# Patient Record
Sex: Male | Born: 1970 | Race: White | Hispanic: No | Marital: Single | Smoking: Former smoker
Health system: Southern US, Community
[De-identification: ages and names within clinical notes are randomized; demographics above are authoritative.]

## PROBLEM LIST (undated history)

## (undated) DIAGNOSIS — E785 Hyperlipidemia, unspecified: Secondary | ICD-10-CM

## (undated) DIAGNOSIS — I10 Essential (primary) hypertension: Secondary | ICD-10-CM

## (undated) HISTORY — PX: NO PAST SURGERIES: SHX2092

## (undated) HISTORY — DX: Hyperlipidemia, unspecified: E78.5

---

## 2019-02-08 ENCOUNTER — Encounter: Payer: Self-pay | Admitting: Emergency Medicine

## 2019-02-08 ENCOUNTER — Ambulatory Visit
Admission: EM | Admit: 2019-02-08 | Discharge: 2019-02-08 | Disposition: A | Discharge status: Home / Self Care | Payer: Self-pay | Attending: Emergency Medicine | Admitting: Emergency Medicine

## 2019-02-08 ENCOUNTER — Other Ambulatory Visit: Payer: Self-pay

## 2019-02-08 DIAGNOSIS — Z760 Encounter for issue of repeat prescription: Secondary | ICD-10-CM

## 2019-02-08 DIAGNOSIS — I10 Essential (primary) hypertension: Secondary | ICD-10-CM

## 2019-02-08 HISTORY — DX: Essential (primary) hypertension: I10

## 2019-02-08 MED ORDER — LISINOPRIL-HYDROCHLOROTHIAZIDE 20-25 MG PO TABS: 1.0000 | ORAL_TABLET | Freq: Every day | ORAL | 1 refills | Status: DC

## 2019-02-08 NOTE — Discharge Instructions (Addendum)
Continue monitoring your blood pressure.  Continue the niacin and fish oil.  Follow-up with a primary care physician of your choice, see list below.  You may be able to seen earlier in November.  I have given you a month's worth of medication with 1 refill.   Return immediately to the ER if you start having chest pain, headache, problems seeing, problems talking, problems walking, if you feel like you're about to pass out, if you do pass out, if you have a seizure, or for any other concerns.  Here is a list of primary care providers who are taking new patients:  Dr. Otilio Miu, Dr. Adline Potter [address] [place] [address] [place] [address]  [phone]  [facility] [address] [place], [place] [address] [phone]  [facility]  [phone] [place], [place] [address]  Here are clinics/ other resources who will see you if you do not have insurance. Some have certain criteria that you must meet. Call them and find out what they are:  [facility]: [address], [place], [place] [address] Phone: [phone] Hours: First and Third Saturdays of each Month, 9 a.m. - 1 p.m.  [facility]: [address], [place], [place], [place] [address] Phone: [phone] Hours: Tuesday, 4 p.m. - 8 p.m. Thursday, 1 p.m. - 8 p.m. Wednesday, 9 a.m. - [facility] [address] [place], [place] [address] Phone: [phone] Pharmacy Phone Number: [phone] Dental Phone Number: [phone] [place] [facility]: [phone]  Dental Hours: Monday - Thursday, 8 a.m. - 6 p.m.  [facility] [address], [place], [place] [address] Phone: [phone] Pharmacy Phone Number: [phone] [facility] Insurance Help: [phone]  [facility] [address] [place], [place] [address] Phone:  [phone] Pharmacy Phone Number: [phone] [facility] Insurance Help: [phone]  [facility] [address] [place], [place] [address] Phone: [phone] [facility] Insurance Help: [phone]   [address] [place], [place] [address] Phone: [phone]  Go to www.goodrx.com to look up your medications. This will give you a list of where you can find your prescriptions at the most affordable prices. Or ask the pharmacist what the cash price is, or if they have any other discount programs available to help make your medication more affordable. This can be less expensive than what you would pay with insurance.

## 2019-02-08 NOTE — ED Provider Notes (Signed)
HPI  SUBJECTIVE:  Joshua Shaw is a 48 y.o. male who presents for medication refill.  States that he ran out of his lisinopril/hydrochlorothiazide 20/25 2 days ago.  States that his PMD recently passed away, and he is unable to see anyone until November.  States that he has been on this dose for years, and it controls his blood pressure well.  He measures his blood pressure at home, states that has been running a little higher since running out, but has had no symptoms.  Denies headaches, dizziness, chest pain, shortness of breath, abdominal pain, hematuria, anuria, leg swelling.  He does not take any other prescription medications.  He has a past medical history of hypertension.  PMD: None.   Past Medical History:  Diagnosis Date  . Hypertension     Past Surgical History:  Procedure Laterality Date  . NO PAST SURGERIES      Family History  Problem Relation Age of Onset  . Cancer Mother        melonma  . Cancer Father        liver    Social History   Tobacco Use  . Smoking status: Former Research scientist (life sciences)  . Smokeless tobacco: Never Used  Substance Use Topics  . Alcohol use: Not Currently  . Drug use: Not Currently    No current facility-administered medications for this encounter.   Current Outpatient Medications:  .  niacin 500 MG tablet, Take 500 mg by mouth at bedtime., Disp: , Rfl:  .  Omega-3 Fatty Acids (FISH OIL) 1000 MG CPDR, Take 2,000 mg by mouth 2 (two) times daily., Disp: , Rfl:  .  lisinopril-hydrochlorothiazide (ZESTORETIC) 20-25 MG tablet, Take 1 tablet by mouth daily., Disp: 30 tablet, Rfl: 1  No Known Allergies   ROS  As noted in HPI.   Physical Exam  BP (!) 144/81 (BP Location: Right Arm)   Pulse 75   Temp 98.4 F (36.9 C) (Oral)   Resp 18   Ht 5\' 10"  (1.778 m)   Wt (!) 147.4 kg   SpO2 98%   BMI 46.63 kg/m   Constitutional: Well developed, well nourished, no acute distress Eyes:  EOMI, conjunctiva normal bilaterally HENT: Normocephalic,  atraumatic,mucus membranes moist Respiratory: Normal inspiratory effort Cardiovascular: Normal rate GI: nondistended skin: No rash, skin intact Musculoskeletal: no deformities Neurologic: Alert & oriented x 3, no focal neuro deficits Psychiatric: Speech and behavior appropriate   ED Course   Medications - No data to display  No orders of the defined types were placed in this encounter.   No results found for this or any previous visit (from the past 24 hour(s)). No results found.  ED Clinical Impression  1. Essential hypertension   2. Medication refill      ED Assessment/Plan  will refill lisinopril/hydrochlorothiazide 20/25, 1 tab daily #30 with 1 refill.  Will provide primary care list so that he can be seen sooner.  Meds ordered this encounter  Medications  . lisinopril-hydrochlorothiazide (ZESTORETIC) 20-25 MG tablet    Sig: Take 1 tablet by mouth daily.    Dispense:  30 tablet    Refill:  1    *This clinic note was created using L[employer]. Therefore, there may be occasional mistakes despite careful proofreading.   ?    Melynda Ripple, MD 02/08/19 [phone]

## 2019-02-08 NOTE — ED Triage Notes (Signed)
Pt presents to [facility] for a refill on his blood pressure medication. He states his former PCP passed away and he can not get in with another PCP until Nov.  He takes lisinopril/HCTZ 20/25mg .

## 2019-03-18 ENCOUNTER — Encounter: Payer: Self-pay | Admitting: Family Medicine

## 2019-03-18 ENCOUNTER — Ambulatory Visit (INDEPENDENT_AMBULATORY_CARE_PROVIDER_SITE_OTHER): Payer: Self-pay | Admitting: Family Medicine

## 2019-03-18 ENCOUNTER — Other Ambulatory Visit: Payer: Self-pay

## 2019-03-18 VITALS — BP 120/70 | HR 64 | Ht 70.0 in | Wt 335.0 lb

## 2019-03-18 DIAGNOSIS — R160 Hepatomegaly, not elsewhere classified: Secondary | ICD-10-CM

## 2019-03-18 DIAGNOSIS — I10 Essential (primary) hypertension: Secondary | ICD-10-CM

## 2019-03-18 DIAGNOSIS — Z7689 Persons encountering health services in other specified circumstances: Secondary | ICD-10-CM

## 2019-03-18 DIAGNOSIS — E785 Hyperlipidemia, unspecified: Secondary | ICD-10-CM

## 2019-03-18 MED ORDER — LISINOPRIL-HYDROCHLOROTHIAZIDE 20-25 MG PO TABS: 1.0000 | ORAL_TABLET | Freq: Every day | ORAL | 1 refills | Status: DC

## 2019-03-18 NOTE — Progress Notes (Signed)
Date:  03/18/2019   Name:  Joshua Shaw   DOB:  11-04-1970   MRN:  [id number]   Chief Complaint: Establish Care and Hypertension  Hypertension This is a new problem. The current episode started more than 1 year ago. The problem is unchanged. The problem is controlled. Pertinent negatives include no anxiety, blurred vision, chest pain, headaches, malaise/fatigue, neck pain, orthopnea, palpitations, peripheral edema, PND, shortness of breath or sweats. There are no associated agents to hypertension. Risk factors for coronary artery disease include dyslipidemia and obesity. Past treatments include diuretics and ACE inhibitors. The current treatment provides moderate improvement. Compliance problems include diet.  There is no history of angina, kidney disease, CAD/MI, CVA, heart failure, left ventricular hypertrophy, PVD or retinopathy. There is no history of chronic renal disease, a hypertension causing med or renovascular disease.    No results found for: CREATININE, BUN, NA, K, CL, CO2 No results found for: CHOL, HDL, LDLCALC, LDLDIRECT, TRIG, CHOLHDL No results found for: TSH No results found for: HGBA1C   Review of Systems  Constitutional: Negative for chills, fever and malaise/fatigue.  HENT: Negative for drooling, ear discharge, ear pain, rhinorrhea, sinus pain and sore throat.   Eyes: Negative for blurred vision.  Respiratory: Negative for cough, shortness of breath and wheezing.   Cardiovascular: Negative for chest pain, palpitations, orthopnea, leg swelling and PND.  Gastrointestinal: Negative for abdominal pain, blood in stool, constipation, diarrhea and nausea.  Endocrine: Negative for polydipsia.  Genitourinary: Negative for dysuria, frequency, hematuria and urgency.  Musculoskeletal: Negative for back pain, myalgias and neck pain.  Skin: Negative for rash.  Allergic/Immunologic: Negative for environmental allergies.  Neurological: Negative for dizziness and  headaches.  Hematological: Does not bruise/bleed easily.  Psychiatric/Behavioral: Negative for suicidal ideas. The patient is not nervous/anxious.     There are no active problems to display for this patient.   No Known Allergies  Past Surgical History:  Procedure Laterality Date  . NO PAST SURGERIES      Social History   Tobacco Use  . Smoking status: Former Smoker    Years: 8.00    Types: Cigarettes    Quit date: 1998    Years since quitting: 22.9  . Smokeless tobacco: Never Used  Substance Use Topics  . Alcohol use: Not Currently  . Drug use: Not Currently     Medication list has been reviewed and updated.  Current Meds  Medication Sig  . aspirin EC 81 MG tablet Take 81 mg by mouth daily.  Marland Kitchen lisinopril-hydrochlorothiazide (ZESTORETIC) 20-25 MG tablet Take 1 tablet by mouth daily.  . Multiple Vitamin (ONE-A-DAY MENS PO) Take 1 capsule by mouth daily.  . niacin 500 MG tablet Take 500 mg by mouth at bedtime. otc  . Omega-3 Fatty Acids (FISH OIL) 1000 MG CPDR Take 1,200 mg by mouth 3 (three) times daily.     PHQ 2/9 Scores 03/18/2019  PHQ - 2 Score 0  PHQ- 9 Score 0    BP Readings from Last 3 Encounters:  03/18/19 120/70  02/08/19 (!) 144/81    Physical Exam Vitals signs and nursing note reviewed.  HENT:     Head: Normocephalic.     Right Ear: Tympanic membrane and external ear normal.     Left Ear: Tympanic membrane and external ear normal.     Nose: Nose normal. No congestion or rhinorrhea.  Eyes:     General: No scleral icterus.       Right eye: No  discharge.        Left eye: No discharge.     Conjunctiva/sclera: Conjunctivae normal.     Pupils: Pupils are equal, round, and reactive to light.  Neck:     Musculoskeletal: Normal range of motion and neck supple.     Thyroid: No thyromegaly.     Vascular: No JVD.     Trachea: No tracheal deviation.  Cardiovascular:     Rate and Rhythm: Normal rate and regular rhythm.     Heart sounds: Normal heart  sounds. No murmur. No friction rub. No gallop.   Pulmonary:     Effort: No respiratory distress.     Breath sounds: Normal breath sounds. No wheezing, rhonchi or rales.  Abdominal:     General: Bowel sounds are normal.     Palpations: Abdomen is soft. There is hepatomegaly. There is no splenomegaly or mass.     Tenderness: There is no abdominal tenderness. There is no guarding or rebound.  Musculoskeletal: Normal range of motion.        General: No tenderness. Injury:   Lymphadenopathy:     Cervical: No cervical adenopathy.  Skin:    General: Skin is warm.     Findings: No rash.  Neurological:     Mental Status: He is alert and oriented to person, place, and time.     Cranial Nerves: No cranial nerve deficit.     Deep Tendon Reflexes: Reflexes are normal and symmetric.     Wt Readings from Last 3 Encounters:  03/18/19 (!) 335 lb (152 kg)  02/08/19 (!) 325 lb (147.4 kg)    BP 120/70   Pulse 64   Ht 5\' 10"  (1.778 m)   Wt (!) 335 lb (152 kg)   BMI 48.07 kg/m   Assessment and Plan:  1. Establishing care with new doctor, encounter for Patient establishes care with new physician.  Patient's previous exams were evaluated but only at the urgent care was available from downstairs.  Patient's records from [facility] urgent care is not available because it is not on epic and they have since closed.  2. Essential hypertension Chronic.  Controlled.  Stable.  Will continue lisinopril hydrochlorothiazide 20-25 mg once a day.  Will check renal function panel. - Renal Function Panel - lisinopril-hydrochlorothiazide (ZESTORETIC) 20-25 MG tablet; Take 1 tablet by mouth daily.  Dispense: 90 tablet; Refill: 1  3. Hyperlipidemia, unspecified hyperlipidemia type Patient has had a history of elevated cholesterol but is only on niacin and omega-3.  We will check a lipid panel because I have concerns because of the fourth evaluation. - Lipid Panel With LDL/HDL Ratio  4. Hepatomegaly On exam  patient was noted to have a palpable liver edge that was smooth.  This is compatible with an hepatomegaly circumstance.  We will check and confirm this with ultrasound of the liver and obtain a hepatic function panel.  I am concerned about steatosis because of the patient's obesity and probable diet indiscretion.  Will recheck in 6 weeks. - Hepatic Function Panel (6) - [place] Abdomen Limited RUQ; Future

## 2019-03-19 LAB — RENAL FUNCTION PANEL
Albumin: 4.4 g/dL (ref 4.0–5.0)
BUN/Creatinine Ratio: 13 (ref 9–20)
BUN: 9 mg/dL (ref 6–24)
CO2: 24 mmol/L (ref 20–29)
Calcium: 9.7 mg/dL (ref 8.7–10.2)
Chloride: 100 mmol/L (ref 96–106)
Creatinine, Ser: 0.72 mg/dL — ABNORMAL LOW (ref 0.76–1.27)
GFR calc Af Amer: 127 mL/min/{1.73_m2} (ref >59)
GFR calc non Af Amer: 110 mL/min/{1.73_m2} (ref >59)
Glucose: 110 mg/dL — ABNORMAL HIGH (ref 65–99)
Phosphorus: 3.1 mg/dL (ref 2.8–4.1)
Potassium: 3.9 mmol/L (ref 3.5–5.2)
Sodium: 139 mmol/L (ref 134–144)

## 2019-03-19 LAB — LIPID PANEL WITH LDL/HDL RATIO
Cholesterol, Total: 223 mg/dL — ABNORMAL HIGH (ref 100–199)
HDL: 28 mg/dL — ABNORMAL LOW (ref >39)
LDL Chol Calc (NIH): 151 mg/dL — ABNORMAL HIGH (ref 0–99)
LDL/HDL Ratio: 5.4 ratio — ABNORMAL HIGH (ref 0.0–3.6)
Triglycerides: 236 mg/dL — ABNORMAL HIGH (ref 0–149)
VLDL Cholesterol Cal: 44 mg/dL — ABNORMAL HIGH (ref 5–40)

## 2019-03-19 LAB — HEPATIC FUNCTION PANEL (6)
ALT: 70 IU/L — ABNORMAL HIGH (ref 0–44)
AST: 65 IU/L — ABNORMAL HIGH (ref 0–40)
Alkaline Phosphatase: 113 IU/L (ref 39–117)
Bilirubin Total: 0.8 mg/dL (ref 0.0–1.2)
Bilirubin, Direct: 0.19 mg/dL (ref 0.00–0.40)

## 2019-03-29 ENCOUNTER — Ambulatory Visit: Payer: Self-pay

## 2019-04-01 ENCOUNTER — Other Ambulatory Visit: Payer: Self-pay

## 2019-04-01 ENCOUNTER — Ambulatory Visit
Admission: RE | Admit: 2019-04-01 | Discharge: 2019-04-01 | Disposition: A | Discharge status: Home / Self Care | Payer: Self-pay | Source: Ambulatory Visit | Attending: Family Medicine | Admitting: Family Medicine

## 2019-04-01 DIAGNOSIS — R160 Hepatomegaly, not elsewhere classified: Secondary | ICD-10-CM | POA: Insufficient documentation

## 2019-04-02 ENCOUNTER — Other Ambulatory Visit: Payer: Self-pay

## 2019-04-02 DIAGNOSIS — K7460 Unspecified cirrhosis of liver: Secondary | ICD-10-CM

## 2019-04-02 NOTE — Progress Notes (Unsigned)
Put in Monday GI appt

## 2019-04-08 ENCOUNTER — Encounter: Payer: Self-pay | Admitting: Family Medicine

## 2019-04-08 ENCOUNTER — Other Ambulatory Visit: Payer: Self-pay

## 2019-04-08 ENCOUNTER — Ambulatory Visit (INDEPENDENT_AMBULATORY_CARE_PROVIDER_SITE_OTHER): Payer: Self-pay | Admitting: Family Medicine

## 2019-04-08 VITALS — BP 130/80 | HR 80 | Ht 70.0 in | Wt 336.0 lb

## 2019-04-08 DIAGNOSIS — K7460 Unspecified cirrhosis of liver: Secondary | ICD-10-CM

## 2019-04-08 NOTE — Progress Notes (Signed)
Date:  04/08/2019   Name:  Joshua Shaw   DOB:  13-Mar-1971   MRN:  [id number]   Chief Complaint: consult [place]  Patient is a 48 year old male who presents for a hepatic exam. The patient reports the following problems: cirrhosis. Health maintenance has been reviewed up to date.   Lab Results  Component Value Date   CREATININE 0.72 (L) 03/18/2019   BUN 9 03/18/2019   NA 139 03/18/2019   K 3.9 03/18/2019   CL 100 03/18/2019   CO2 24 03/18/2019   Lab Results  Component Value Date   CHOL 223 (H) 03/18/2019   HDL 28 (L) 03/18/2019   LDLCALC 151 (H) 03/18/2019   TRIG 236 (H) 03/18/2019   No results found for: TSH No results found for: HGBA1C   Review of Systems  Constitutional: Negative for chills and fever.  HENT: Negative for drooling, ear discharge, ear pain, postnasal drip and sore throat.   Respiratory: Negative for cough, shortness of breath and wheezing.   Cardiovascular: Negative for chest pain, palpitations and leg swelling.  Gastrointestinal: Negative for abdominal pain, blood in stool, constipation, diarrhea and nausea.  Endocrine: Negative for polydipsia.  Genitourinary: Negative for dysuria, frequency, hematuria and urgency.  Musculoskeletal: Negative for back pain, myalgias and neck pain.  Skin: Negative for rash.  Allergic/Immunologic: Negative for environmental allergies.  Neurological: Negative for dizziness and headaches.  Hematological: Does not bruise/bleed easily.  Psychiatric/Behavioral: Negative for suicidal ideas. The patient is not nervous/anxious.     There are no problems to display for this patient.   No Known Allergies  Past Surgical History:  Procedure Laterality Date  . NO PAST SURGERIES      Social History   Tobacco Use  . Smoking status: Former Smoker    Years: 8.00    Types: Cigarettes    Quit date: 1998    Years since quitting: 22.9  . Smokeless tobacco: Never Used  Substance Use Topics  . Alcohol use: Not  Currently  . Drug use: Not Currently     Medication list has been reviewed and updated.  Current Meds  Medication Sig  . aspirin EC 81 MG tablet Take 81 mg by mouth daily.  Marland Kitchen lisinopril-hydrochlorothiazide (ZESTORETIC) 20-25 MG tablet Take 1 tablet by mouth daily.  . Multiple Vitamin (ONE-A-DAY MENS PO) Take 1 capsule by mouth daily.  . niacin 500 MG tablet Take 500 mg by mouth at bedtime. otc  . Omega-3 Fatty Acids (FISH OIL) 1000 MG CPDR Take 1,200 mg by mouth 3 (three) times daily.     PHQ 2/9 Scores 03/18/2019  PHQ - 2 Score 0  PHQ- 9 Score 0    BP Readings from Last 3 Encounters:  04/08/19 130/80  03/18/19 120/70  02/08/19 (!) 144/81    Physical Exam Vitals and nursing note reviewed.  HENT:     Head: Normocephalic.     Right Ear: Tympanic membrane and external ear normal.     Left Ear: Tympanic membrane and external ear normal.     Nose: Nose normal. No congestion or rhinorrhea.  Eyes:     General: No scleral icterus.       Right eye: No discharge.        Left eye: No discharge.     Conjunctiva/sclera: Conjunctivae normal.     Pupils: Pupils are equal, round, and reactive to light.  Neck:     Thyroid: No thyromegaly.     Vascular: No  JVD.     Trachea: No tracheal deviation.  Cardiovascular:     Rate and Rhythm: Normal rate and regular rhythm.     Heart sounds: Normal heart sounds. No murmur. No friction rub. No gallop.   Pulmonary:     Effort: No respiratory distress.     Breath sounds: Normal breath sounds. No wheezing, rhonchi or rales.  Abdominal:     General: Bowel sounds are normal.     Palpations: Abdomen is soft. There is hepatomegaly. There is no splenomegaly or mass.     Tenderness: There is no abdominal tenderness. There is no guarding or rebound.     Comments: Firm palpable liver edge.Nontender.  Musculoskeletal:        General: No tenderness. Normal range of motion.     Cervical back: Normal range of motion and neck supple.  Lymphadenopathy:       Cervical: No cervical adenopathy.  Skin:    General: Skin is warm.     Findings: No rash.  Neurological:     Mental Status: He is alert and oriented to person, place, and time.     Cranial Nerves: No cranial nerve deficit.     Deep Tendon Reflexes: Reflexes are normal and symmetric.     Wt Readings from Last 3 Encounters:  04/08/19 (!) 336 lb (152.4 kg)  03/18/19 (!) 335 lb (152 kg)  02/08/19 (!) 325 lb (147.4 kg)    BP 130/80   Pulse 80   Ht 5\' 10"  (1.778 m)   Wt (!) 336 lb (152.4 kg)   BMI 48.21 kg/m   Assessment and Plan: 1. Hepatic cirrhosis, unspecified hepatic cirrhosis type, unspecified whether ascites present [facility]) Patient returns for determination of direction of evaluation of a palpable liver edge with mild transaminase elevation and ultrasound that is suggesting of cirrhosis.  On review of patient's family history patient does note that his father died of what sounds like hepatic failure after presenting with ascites over a short period of time.  Patient is concerned whether or not that this may be of a similar nature in the future.  I have assured patient that we will process going and evaluating further with further lab work, possible further imaging, possible liver biopsy.  I spoke with [facility] for [facility] and they are in the process of getting him into gastroenterology clinic with [facility] clinic and [facility]. - Ambulatory referral to Gastroenterology

## 2019-04-08 NOTE — Patient Instructions (Signed)
Alpha-1 Antitrypsin Deficiency, Adult  Alpha-1 antitrypsin (AAT) is a protein that helps the lungs and other organs work properly. It is made by the liver. AAT deficiency is a genetic condition that happens when the liver produces too little AAT, no AAT, or AAT that does not work properly. AAT deficiency can cause liver disease, lung disease, and a skin condition called panniculitis (rare). Being around things like smoke and dust can make these problems worse. What are the causes? This condition is caused by a genetic defect that is passed from parent to child (inherited). The condition typically develops only if a person inherits the defective gene from both parents. What increases the risk? You are more likely to develop this condition if:  You have a family history of the condition.  You are Caucasian and have European ancestry. What are the signs or symptoms? Symptoms of this condition include:  Shortness of breath.  Asthma that is difficult to control.  Having noisy breathing (wheeze).  Coughing.  Recurring infections in the breathing (respiratory) system.  Unexplained weight loss.  Rapid heartbeat.  Fatigue.  Abdominal pain.  A yellowish color of the skin or the white parts of the eyes (jaundice).  Swelling of the ankles or abdomen.  Hardened skin with painful lumps(panniculitis). How is this diagnosed? This condition is diagnosed with a blood test or with a DNA sample taken from your mouth. You may also have other tests, including:  Imaging studies of your chest, such as an X-ray or CT scan.  Lung function tests to see how well your lungs are working.  Liver function tests to see how well your liver is working.  A liver biopsy to check for damage to your liver. How is this treated? There is no cure for AAT deficiency. However, treatments can relieve symptoms and improve quality of life. Treatment options include:  Inhaled steroids or bronchodilators. These  medicines can help with breathing problems.  Pulmonary rehabilitation. This is a program that helps to improve lung function and teaches you to breathe more efficiently.  AAT augmentation therapy. This involves weekly injections to increase your level of AAT.  Prescription vitamins, including vitamins D, E, and K. Vitamins can help to maintain normal liver function.  Medicine to relieve symptoms that are related to liver problems, including jaundice, fluid retention, and severe itching.  A lung transplant. This is rarely needed. It may help someone who has a severe AAT deficiency. Follow these instructions at home: Lifestyle   Do not use any products that contain nicotine or tobacco, such as cigarettes and e-cigarettes. If you need help quitting, ask your health care provider.  Avoid secondhand smoke.  Do not drink alcohol.  Exercise regularly. Reducing breathing problems  Stay indoors when air quality is poor.  Wear a mask to keep your airways free of dust.  Talk to your health care provider about getting flu (influenza) and pneumonia shots to prevent respiratory infections.  Wash your hands often to prevent infections.  Avoid activities such as mowing the lawn or vacuuming when possible. General instructions  Take over-the-counter and prescription medicines only as told by your health care provider.  Keep all follow-up visits as told by your health care provider. This is important. Contact a health care provider if you:  Often get respiratory infections, such as bronchitis or pneumonia.  Wheeze, cough, or have other breathing problems that are not helped with medicine.  Experience new liver-related problems, such as jaundice. Get help right away if you:  Notice that your respiratory infection does not go away completely, or it returns after treatment.  Become dizzy or weak or you pass out because your breathing problems are so bad. Summary  Alpha-1 antitrypsin is  a protein that is made in the liver and helps the lungs and other organs work properly.  A deficiency of this protein can occur if a person inherits the defective gene from both parents. It is diagnosed with a blood or DNA test.  Symptoms of the deficiency include coughing, shortness of breath, and fatigue.  The deficiency is treated with supportive measures that will help your breathing and sometimes with injections of the protein.  Call your health care provider if you have breathing problems that will not go away. This information is not intended to replace advice given to you by your health care provider. Make sure you discuss any questions you have with your health care provider. Document Released: 03/12/2004 Document Revised: 08/18/2017 Document Reviewed: 02/06/2017 Elsevier Patient Education  2020 [employer].

## 2019-04-17 ENCOUNTER — Other Ambulatory Visit: Payer: Self-pay | Admitting: Gastroenterology

## 2019-04-17 DIAGNOSIS — R748 Abnormal levels of other serum enzymes: Secondary | ICD-10-CM

## 2019-04-24 ENCOUNTER — Ambulatory Visit: Payer: Self-pay

## 2019-04-29 ENCOUNTER — Ambulatory Visit
Admission: RE | Admit: 2019-04-29 | Discharge: 2019-04-29 | Disposition: A | Discharge status: Home / Self Care | Payer: Self-pay | Source: Ambulatory Visit | Attending: Gastroenterology | Admitting: Gastroenterology

## 2019-04-29 ENCOUNTER — Other Ambulatory Visit: Payer: Self-pay

## 2019-04-29 ENCOUNTER — Other Ambulatory Visit
Admission: RE | Admit: 2019-04-29 | Discharge: 2019-04-29 | Disposition: A | Discharge status: Home / Self Care | Payer: Self-pay | Source: Ambulatory Visit | Attending: Gastroenterology | Admitting: Gastroenterology

## 2019-04-29 DIAGNOSIS — R748 Abnormal levels of other serum enzymes: Secondary | ICD-10-CM | POA: Insufficient documentation

## 2019-05-02 LAB — MISC LABCORP TEST (SEND OUT): Labcorp test code: 511345

## 2019-06-18 ENCOUNTER — Ambulatory Visit: Payer: Self-pay | Admitting: Family Medicine

## 2019-06-24 ENCOUNTER — Ambulatory Visit: Payer: Self-pay | Admitting: Family Medicine

## 2019-07-22 ENCOUNTER — Ambulatory Visit: Payer: Self-pay | Admitting: Family Medicine

## 2019-08-12 ENCOUNTER — Encounter: Payer: Self-pay | Admitting: Family Medicine

## 2019-08-12 ENCOUNTER — Ambulatory Visit (INDEPENDENT_AMBULATORY_CARE_PROVIDER_SITE_OTHER): Payer: Self-pay | Admitting: Family Medicine

## 2019-08-12 ENCOUNTER — Other Ambulatory Visit: Payer: Self-pay

## 2019-08-12 VITALS — BP 124/70 | HR 64 | Ht 70.0 in | Wt 319.0 lb

## 2019-08-12 DIAGNOSIS — K7460 Unspecified cirrhosis of liver: Secondary | ICD-10-CM | POA: Insufficient documentation

## 2019-08-12 DIAGNOSIS — E782 Mixed hyperlipidemia: Secondary | ICD-10-CM

## 2019-08-12 DIAGNOSIS — I10 Essential (primary) hypertension: Secondary | ICD-10-CM

## 2019-08-12 DIAGNOSIS — R739 Hyperglycemia, unspecified: Secondary | ICD-10-CM

## 2019-08-12 MED ORDER — LISINOPRIL-HYDROCHLOROTHIAZIDE 20-25 MG PO TABS: 1.0000 | ORAL_TABLET | Freq: Every day | ORAL | 1 refills | Status: DC

## 2019-08-12 NOTE — Patient Instructions (Signed)

## 2019-08-12 NOTE — Progress Notes (Signed)
Date:  08/12/2019   Name:  Joshua Shaw   DOB:  [birth year]   MRN:  [id number]   Chief Complaint: Hypertension and Hyperlipidemia (needs labs)  Hypertension This is a chronic problem. The current episode started more than 1 year ago. The problem has been waxing and waning since onset. The problem is controlled. Pertinent negatives include no anxiety, blurred vision, chest pain, headaches, malaise/fatigue, neck pain, orthopnea, palpitations, peripheral edema, PND, shortness of breath or sweats. There are no associated agents to hypertension. Risk factors for coronary artery disease include dyslipidemia and male gender. Past treatments include ACE inhibitors and diuretics. The current treatment provides moderate improvement. There are no compliance problems.  There is no history of angina, kidney disease, CAD/MI, CVA, heart failure, left ventricular hypertrophy, PVD or retinopathy. There is no history of chronic renal disease, a hypertension causing med or renovascular disease.  Hyperlipidemia This is a chronic problem. The current episode started more than 1 year ago. The problem is controlled. Recent lipid tests were reviewed and are normal. He has no history of chronic renal disease, diabetes, hypothyroidism, liver disease, obesity or nephrotic syndrome. Pertinent negatives include no chest pain, focal sensory loss, focal weakness, leg pain, myalgias or shortness of breath. The current treatment provides moderate improvement of lipids. There are no compliance problems.     Lab Results  Component Value Date   CREATININE 0.72 (L) 03/18/2019   BUN 9 03/18/2019   NA 139 03/18/2019   K 3.9 03/18/2019   CL 100 03/18/2019   CO2 24 03/18/2019   Lab Results  Component Value Date   CHOL 223 (H) 03/18/2019   HDL 28 (L) 03/18/2019   LDLCALC 151 (H) 03/18/2019   TRIG 236 (H) 03/18/2019   No results found for: TSH No results found for: HGBA1C No results found for: WBC, HGB, HCT, MCV,  PLT Lab Results  Component Value Date   ALT 70 (H) 03/18/2019   AST 65 (H) 03/18/2019   ALKPHOS 113 03/18/2019   BILITOT 0.8 03/18/2019     Review of Systems  Constitutional: Negative for chills, fever and malaise/fatigue.  HENT: Negative for drooling, ear discharge, ear pain and sore throat.   Eyes: Negative for blurred vision.  Respiratory: Negative for cough, shortness of breath and wheezing.   Cardiovascular: Negative for chest pain, palpitations, orthopnea, leg swelling and PND.  Gastrointestinal: Negative for abdominal pain, blood in stool, constipation, diarrhea and nausea.  Endocrine: Negative for polydipsia.  Genitourinary: Negative for dysuria, frequency, hematuria and urgency.  Musculoskeletal: Negative for back pain, myalgias and neck pain.  Skin: Negative for rash.  Allergic/Immunologic: Negative for environmental allergies.  Neurological: Negative for dizziness, focal weakness and headaches.  Hematological: Does not bruise/bleed easily.  Psychiatric/Behavioral: Negative for suicidal ideas. The patient is not nervous/anxious.     There are no problems to display for this patient.   No Known Allergies  Past Surgical History:  Procedure Laterality Date  . NO PAST SURGERIES      Social History   Tobacco Use  . Smoking status: Former Smoker    Years: 8.00    Types: Cigarettes    Quit date: 1998    Years since quitting: 23.3  . Smokeless tobacco: Never Used  Substance Use Topics  . Alcohol use: Not Currently  . Drug use: Not Currently     Medication list has been reviewed and updated.  Current Meds  Medication Sig  . aspirin EC 81 MG tablet Take  81 mg by mouth daily.  Marland Kitchen lisinopril-hydrochlorothiazide (ZESTORETIC) 20-25 MG tablet Take 1 tablet by mouth daily.  . Multiple Vitamin (ONE-A-DAY MENS PO) Take 1 capsule by mouth daily.  . niacin 500 MG tablet Take 500 mg by mouth at bedtime. otc  . Omega-3 Fatty Acids (FISH OIL) 1000 MG CPDR Take 1,200 mg  by mouth in the morning and at bedtime.     PHQ 2/9 Scores 08/12/2019 03/18/2019  PHQ - 2 Score 1 0  PHQ- 9 Score 1 0    BP Readings from Last 3 Encounters:  08/12/19 124/70  04/08/19 130/80  03/18/19 120/70    Physical Exam Vitals and nursing note reviewed.  HENT:     Head: Normocephalic.     Right Ear: Tympanic membrane, ear canal and external ear normal.     Left Ear: Tympanic membrane, ear canal and external ear normal.     Nose: Nose normal. No congestion or rhinorrhea.     Mouth/Throat:     Mouth: Mucous membranes are moist.  Eyes:     General: No scleral icterus.       Right eye: No discharge.        Left eye: No discharge.     Conjunctiva/sclera: Conjunctivae normal.     Pupils: Pupils are equal, round, and reactive to light.  Neck:     Thyroid: No thyromegaly.     Vascular: No JVD.     Trachea: No tracheal deviation.  Cardiovascular:     Rate and Rhythm: Normal rate and regular rhythm.     Heart sounds: Normal heart sounds. No murmur. No friction rub. No gallop.   Pulmonary:     Effort: No respiratory distress.     Breath sounds: Normal breath sounds. No wheezing, rhonchi or rales.  Abdominal:     General: Bowel sounds are normal.     Palpations: Abdomen is soft. There is no mass.     Tenderness: There is no abdominal tenderness. There is no guarding or rebound.  Musculoskeletal:        General: No tenderness. Normal range of motion.     Cervical back: Normal range of motion and neck supple.  Lymphadenopathy:     Cervical: No cervical adenopathy.  Skin:    General: Skin is warm.     Findings: No rash.  Neurological:     Mental Status: He is alert and oriented to person, place, and time.     Cranial Nerves: No cranial nerve deficit.     Deep Tendon Reflexes: Reflexes are normal and symmetric.     Wt Readings from Last 3 Encounters:  08/12/19 (!) 319 lb (144.7 kg)  04/08/19 (!) 336 lb (152.4 kg)  03/18/19 (!) 335 lb (152 kg)    BP 124/70   Pulse  64   Ht 5\' 10"  (1.778 m)   Wt (!) 319 lb (144.7 kg)   BMI 45.77 kg/m   Assessment and Plan: 1. Essential hypertension Chronic.  Controlled.  Stable.  Continue lisinopril hydrochlorothiazide 20-25 mg once a day.  Will check renal function panel. - Renal Function Panel - lisinopril-hydrochlorothiazide (ZESTORETIC) 20-25 MG tablet; Take 1 tablet by mouth daily.  Dispense: 90 tablet; Refill: 1  2. Hepatic cirrhosis, unspecified hepatic cirrhosis type, unspecified whether ascites present (HCC) Chronic.  Controlled.  Stable.  Patient with history of fatty liver with some cirrhosis being followed by GI/Christy [facility].  Will check lipid panel to see what the extent of hyperlipidemia patient has  been on lovastatin in the past and has tolerated will consider either atorvastatin or Zetia if indicated. - Lipid panel  3. Hyperglycemia Patient has a history of mild hyperglycemia but he is somewhat overweight and is at risk for prediabetes and we will check hemoglobin A1c. - Hemoglobin A1c  4. Mixed hyperlipidemia On evaluation patient has elevations of triglycerides total cholesterol as well as LDL patient also has a decrease in HDL.  We will recheck lipid panel patient has been given a low-cholesterol sheet for guideline.  Pending lab evaluation we may be starting lipid-lowering therapy. - Lipid panel

## 2019-08-13 ENCOUNTER — Other Ambulatory Visit: Payer: Self-pay

## 2019-08-13 DIAGNOSIS — E782 Mixed hyperlipidemia: Secondary | ICD-10-CM

## 2019-08-13 LAB — RENAL FUNCTION PANEL
Albumin: 4.3 g/dL (ref 4.0–5.0)
BUN/Creatinine Ratio: 18 (ref 9–20)
BUN: 14 mg/dL (ref 6–24)
CO2: 22 mmol/L (ref 20–29)
Calcium: 9.8 mg/dL (ref 8.7–10.2)
Chloride: 103 mmol/L (ref 96–106)
Creatinine, Ser: 0.76 mg/dL (ref 0.76–1.27)
GFR calc Af Amer: 124 mL/min/{1.73_m2} (ref >59)
GFR calc non Af Amer: 107 mL/min/{1.73_m2} (ref >59)
Glucose: 104 mg/dL — ABNORMAL HIGH (ref 65–99)
Phosphorus: 3.1 mg/dL (ref 2.8–4.1)
Potassium: 4.1 mmol/L (ref 3.5–5.2)
Sodium: 142 mmol/L (ref 134–144)

## 2019-08-13 LAB — LIPID PANEL
Chol/HDL Ratio: 6.1 ratio — ABNORMAL HIGH (ref 0.0–5.0)
Cholesterol, Total: 183 mg/dL (ref 100–199)
HDL: 30 mg/dL — ABNORMAL LOW (ref >39)
LDL Chol Calc (NIH): 111 mg/dL — ABNORMAL HIGH (ref 0–99)
Triglycerides: 238 mg/dL — ABNORMAL HIGH (ref 0–149)
VLDL Cholesterol Cal: 42 mg/dL — ABNORMAL HIGH (ref 5–40)

## 2019-08-13 LAB — HEMOGLOBIN A1C
Est. average glucose Bld gHb Est-mCnc: 120 mg/dL
Hgb A1c MFr Bld: 5.8 % — ABNORMAL HIGH (ref 4.8–5.6)

## 2019-08-13 MED ORDER — EZETIMIBE 10 MG PO TABS: 10.0000 mg | ORAL_TABLET | Freq: Every day | ORAL | 1 refills | Status: DC

## 2019-08-13 NOTE — Progress Notes (Signed)
Sent in [facility]

## 2019-10-10 ENCOUNTER — Other Ambulatory Visit: Payer: Self-pay

## 2019-10-10 DIAGNOSIS — R69 Illness, unspecified: Secondary | ICD-10-CM

## 2019-10-10 DIAGNOSIS — E782 Mixed hyperlipidemia: Secondary | ICD-10-CM

## 2019-10-15 ENCOUNTER — Other Ambulatory Visit: Payer: Self-pay

## 2019-10-15 DIAGNOSIS — E782 Mixed hyperlipidemia: Secondary | ICD-10-CM

## 2019-10-15 LAB — LIPID PANEL WITH LDL/HDL RATIO
Cholesterol, Total: 146 mg/dL (ref 100–199)
HDL: 31 mg/dL — ABNORMAL LOW (ref >39)
LDL Chol Calc (NIH): 80 mg/dL (ref 0–99)
LDL/HDL Ratio: 2.6 ratio (ref 0.0–3.6)
Triglycerides: 208 mg/dL — ABNORMAL HIGH (ref 0–149)
VLDL Cholesterol Cal: 35 mg/dL (ref 5–40)

## 2019-10-15 LAB — HEPATIC FUNCTION PANEL
ALT: 47 IU/L — ABNORMAL HIGH (ref 0–44)
AST: 39 IU/L (ref 0–40)
Albumin: 4 g/dL (ref 4.0–5.0)
Alkaline Phosphatase: 119 IU/L (ref 48–121)
Bilirubin Total: 0.5 mg/dL (ref 0.0–1.2)
Bilirubin, Direct: 0.13 mg/dL (ref 0.00–0.40)
Total Protein: 7.6 g/dL (ref 6.0–8.5)

## 2019-10-15 MED ORDER — EZETIMIBE 10 MG PO TABS: 10.0000 mg | ORAL_TABLET | Freq: Every day | ORAL | 1 refills | Status: DC

## 2019-10-15 NOTE — Progress Notes (Unsigned)
Sent in Zetia 

## 2020-02-17 ENCOUNTER — Ambulatory Visit: Payer: Self-pay | Admitting: Family Medicine

## 2020-03-18 ENCOUNTER — Telehealth: Payer: Self-pay

## 2020-03-18 NOTE — Telephone Encounter (Signed)
noted 

## 2020-03-18 NOTE — Telephone Encounter (Signed)
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

## 2020-03-18 NOTE — Telephone Encounter (Signed)
Patient is requesting refill on these medications he is scheduled for office visit but needs them till his appointment.  ezetimibe (ZETIA) 10 MG tablet [[id number]] and  lisinopril-hydrochlorothiazide (ZESTORETIC) 20-25 MG tablet [[id number]]     [facility] [address] ([address]), [place] - [address]  [address] [place] ([place]) [place] [address]  Phone:  [phone] Fax:  [phone]  DEA #:  --

## 2020-03-23 ENCOUNTER — Other Ambulatory Visit: Payer: Self-pay

## 2020-03-23 DIAGNOSIS — I10 Essential (primary) hypertension: Secondary | ICD-10-CM

## 2020-03-23 MED ORDER — LISINOPRIL-HYDROCHLOROTHIAZIDE 20-25 MG PO TABS: 1.0000 | ORAL_TABLET | Freq: Every day | ORAL | 0 refills | Status: DC

## 2020-03-23 NOTE — Telephone Encounter (Signed)
Sent in 10 days to get patient to upcoming appt. Called patient and informed.

## 2020-03-23 NOTE — Telephone Encounter (Signed)
Pt called requesting only the lisinopril-hydrochlorothiazide (ZESTORETIC) 20-25 MG tablet  States that he will need at least 10 days supply to last until his upcoming appt.  Pt is requesting a call back for updates on request   Best contact: [phone]

## 2020-03-31 ENCOUNTER — Ambulatory Visit: Payer: Self-pay | Admitting: Family Medicine

## 2020-04-07 ENCOUNTER — Other Ambulatory Visit: Payer: Self-pay

## 2020-04-07 ENCOUNTER — Ambulatory Visit (INDEPENDENT_AMBULATORY_CARE_PROVIDER_SITE_OTHER): Payer: Self-pay | Admitting: Family Medicine

## 2020-04-07 ENCOUNTER — Encounter: Payer: Self-pay | Admitting: Family Medicine

## 2020-04-07 VITALS — BP 130/88 | HR 88 | Ht 70.0 in | Wt 334.0 lb

## 2020-04-07 DIAGNOSIS — E782 Mixed hyperlipidemia: Secondary | ICD-10-CM

## 2020-04-07 DIAGNOSIS — I10 Essential (primary) hypertension: Secondary | ICD-10-CM

## 2020-04-07 MED ORDER — EZETIMIBE 10 MG PO TABS: 10.0000 mg | ORAL_TABLET | Freq: Every day | ORAL | 1 refills | Status: DC

## 2020-04-07 MED ORDER — LISINOPRIL-HYDROCHLOROTHIAZIDE 20-25 MG PO TABS: 1.0000 | ORAL_TABLET | Freq: Every day | ORAL | 1 refills | Status: DC

## 2020-04-07 NOTE — Progress Notes (Signed)
Date:  04/07/2020   Name:  Joshua Shaw   DOB:  Oct 15, 1970   MRN:  [id number]   Chief Complaint: Hypertension and Hyperlipidemia  Hypertension This is a chronic problem. The current episode started more than 1 year ago. The problem has been gradually improving since onset. The problem is controlled. Pertinent negatives include no anxiety, blurred vision, chest pain, headaches, malaise/fatigue, neck pain, orthopnea, palpitations, peripheral edema, PND, shortness of breath or sweats. There are no associated agents to hypertension. There are no known risk factors for coronary artery disease. Past treatments include ACE inhibitors and diuretics. The current treatment provides no improvement. There are no compliance problems.  There is no history of angina, kidney disease, CAD/MI, CVA, heart failure, left ventricular hypertrophy, PVD or retinopathy. There is no history of chronic renal disease, a hypertension causing med or renovascular disease.  Hyperlipidemia This is a chronic problem. The current episode started more than 1 year ago. The problem is controlled. Recent lipid tests were reviewed and are normal. He has no history of chronic renal disease, diabetes, hypothyroidism, liver disease, obesity or nephrotic syndrome. Pertinent negatives include no chest pain, myalgias or shortness of breath. Current antihyperlipidemic treatment includes ezetimibe. The current treatment provides moderate improvement of lipids.    Lab Results  Component Value Date   CREATININE 0.76 08/12/2019   BUN 14 08/12/2019   NA 142 08/12/2019   K 4.1 08/12/2019   CL 103 08/12/2019   CO2 22 08/12/2019   Lab Results  Component Value Date   CHOL 146 10/14/2019   HDL 31 (L) 10/14/2019   LDLCALC 80 10/14/2019   TRIG 208 (H) 10/14/2019   CHOLHDL 6.1 (H) 08/12/2019   No results found for: TSH Lab Results  Component Value Date   HGBA1C 5.8 (H) 08/12/2019   No results found for: WBC, HGB, HCT, MCV,  PLT Lab Results  Component Value Date   ALT 47 (H) 10/14/2019   AST 39 10/14/2019   ALKPHOS 119 10/14/2019   BILITOT 0.5 10/14/2019     Review of Systems  Constitutional: Negative for chills, fever and malaise/fatigue.  HENT: Negative for drooling, ear discharge, ear pain and sore throat.   Eyes: Negative for blurred vision.  Respiratory: Negative for cough, shortness of breath and wheezing.   Cardiovascular: Negative for chest pain, palpitations, orthopnea, leg swelling and PND.  Gastrointestinal: Negative for abdominal pain, blood in stool, constipation, diarrhea and nausea.  Endocrine: Negative for polydipsia.  Genitourinary: Negative for dysuria, frequency, hematuria and urgency.  Musculoskeletal: Negative for back pain, myalgias and neck pain.  Skin: Negative for rash.  Allergic/Immunologic: Negative for environmental allergies.  Neurological: Negative for dizziness and headaches.  Hematological: Does not bruise/bleed easily.  Psychiatric/Behavioral: Negative for suicidal ideas. The patient is not nervous/anxious.     Patient Active Problem List   Diagnosis Date Noted  . Hepatic cirrhosis (HCC) 08/12/2019    No Known Allergies  Past Surgical History:  Procedure Laterality Date  . NO PAST SURGERIES      Social History   Tobacco Use  . Smoking status: Former Smoker    Years: 8.00    Types: Cigarettes    Quit date: 1998    Years since quitting: 23.9  . Smokeless tobacco: Never Used  Vaping Use  . Vaping Use: Never used  Substance Use Topics  . Alcohol use: Not Currently  . Drug use: Not Currently     Medication list has been reviewed and updated.  Current Meds  Medication Sig  . aspirin EC 81 MG tablet Take 81 mg by mouth daily.  Marland Kitchen ezetimibe (ZETIA) 10 MG tablet Take 1 tablet (10 mg total) by mouth daily.  Marland Kitchen lisinopril-hydrochlorothiazide (ZESTORETIC) 20-25 MG tablet Take 1 tablet by mouth daily.  . niacin 500 MG tablet Take 500 mg by mouth at bedtime.  otc  . Omega-3 Fatty Acids (FISH OIL) 1000 MG CPDR Take 1,200 mg by mouth in the morning and at bedtime.   . [DISCONTINUED] Multiple Vitamin (ONE-A-DAY MENS PO) Take 1 capsule by mouth daily.    PHQ 2/9 Scores 08/12/2019 03/18/2019  PHQ - 2 Score 1 0  PHQ- 9 Score 1 0    GAD 7 : Generalized Anxiety Score 08/12/2019 03/18/2019  Nervous, Anxious, on Edge 0 0  Control/stop worrying 0 0  Worry too much - different things 0 0  Trouble relaxing 0 0  Restless 0 0  Easily annoyed or irritable 0 0  Afraid - awful might happen 0 0  Total GAD 7 Score 0 0    BP Readings from Last 3 Encounters:  04/07/20 130/88  08/12/19 124/70  04/08/19 130/80    Physical Exam Vitals and nursing note reviewed.  HENT:     Head: Normocephalic.     Right Ear: Tympanic membrane, ear canal and external ear normal.     Left Ear: Tympanic membrane, ear canal and external ear normal.     Nose: Nose normal. No congestion or rhinorrhea.     Mouth/Throat:     Mouth: Oropharynx is clear and moist. Mucous membranes are moist.  Eyes:     General: No scleral icterus.       Right eye: No discharge.        Left eye: No discharge.     Extraocular Movements: EOM normal.     Conjunctiva/sclera: Conjunctivae normal.     Pupils: Pupils are equal, round, and reactive to light.  Neck:     Thyroid: No thyromegaly.     Vascular: No JVD.     Trachea: No tracheal deviation.  Cardiovascular:     Rate and Rhythm: Normal rate and regular rhythm.     Pulses: Intact distal pulses.     Heart sounds: Normal heart sounds. No murmur heard. No friction rub. No gallop.   Pulmonary:     Effort: No respiratory distress.     Breath sounds: Normal breath sounds. No wheezing, rhonchi or rales.  Abdominal:     General: Bowel sounds are normal.     Palpations: Abdomen is soft. There is no hepatosplenomegaly or mass.     Tenderness: There is no abdominal tenderness. There is no CVA tenderness, guarding or rebound.  Musculoskeletal:         General: No tenderness or edema. Normal range of motion.     Cervical back: Normal range of motion and neck supple.  Lymphadenopathy:     Cervical: No cervical adenopathy.  Skin:    General: Skin is warm.     Findings: No rash.  Neurological:     Mental Status: He is alert and oriented to person, place, and time.     Cranial Nerves: No cranial nerve deficit.     Deep Tendon Reflexes: Strength normal and reflexes are normal and symmetric.     Wt Readings from Last 3 Encounters:  04/07/20 (!) 334 lb (151.5 kg)  08/12/19 (!) 319 lb (144.7 kg)  04/08/19 (!) 336 lb (152.4 kg)  BP 130/88   Pulse 88   Ht 5\' 10"  (1.778 m)   Wt (!) 334 lb (151.5 kg)   BMI 47.92 kg/m   Assessment and Plan:  1. Essential hypertension Chronic.  Controlled.  Stable.  Blood pressure today is 130/88.  We will continue lisinopril hydrochlorothiazide 20-25 mg once a day.  Will recheck in 6 months - lisinopril-hydrochlorothiazide (ZESTORETIC) 20-25 MG tablet; Take 1 tablet by mouth daily.  Dispense: 90 tablet; Refill: 1  2. Mixed hyperlipidemia Chronic.  Controlled.  Stable.  We will continue combination of niacin 500 mg daily, omega-3 1 g daily, and Zetia 10 mg once a day.  Will recheck in 6 months at which time we will do a fasting lipid panel. - ezetimibe (ZETIA) 10 MG tablet; Take 1 tablet (10 mg total) by mouth daily.  Dispense: 90 tablet; Refill: 1                                                                                            I spent 30 minutes with this patient, More than 50% of that time was spent in face to face education, counseling and care coordination.

## 2020-10-07 ENCOUNTER — Encounter: Payer: Self-pay | Admitting: Family Medicine

## 2020-11-04 ENCOUNTER — Encounter: Payer: Self-pay | Admitting: Family Medicine

## 2020-11-04 ENCOUNTER — Ambulatory Visit: Payer: 59 | Admitting: Family Medicine

## 2020-11-04 ENCOUNTER — Other Ambulatory Visit: Payer: Self-pay

## 2020-11-04 VITALS — BP 130/80 | HR 80 | Ht 70.0 in | Wt 326.0 lb

## 2020-11-04 DIAGNOSIS — I10 Essential (primary) hypertension: Secondary | ICD-10-CM | POA: Diagnosis not present

## 2020-11-04 DIAGNOSIS — K7460 Unspecified cirrhosis of liver: Secondary | ICD-10-CM

## 2020-11-04 DIAGNOSIS — E782 Mixed hyperlipidemia: Secondary | ICD-10-CM

## 2020-11-04 MED ORDER — EZETIMIBE 10 MG PO TABS: 10.0000 mg | ORAL_TABLET | Freq: Every day | ORAL | 1 refills | Status: DC

## 2020-11-04 MED ORDER — LISINOPRIL-HYDROCHLOROTHIAZIDE 20-25 MG PO TABS: 1.0000 | ORAL_TABLET | Freq: Every day | ORAL | 1 refills | Status: DC

## 2020-11-04 NOTE — Progress Notes (Signed)
Date:  11/04/2020   Name:  Joshua Shaw   DOB:  Mar 24, 1971   MRN:  [id number]   Chief Complaint: Hypertension and Hyperlipidemia  Hypertension This is a chronic problem. The current episode started more than 1 year ago. The problem has been gradually improving since onset. The problem is controlled. Pertinent negatives include no anxiety, blurred vision, chest pain, headaches, malaise/fatigue, neck pain, orthopnea, palpitations, peripheral edema, PND, shortness of breath or sweats. There are no associated agents to hypertension. Risk factors for coronary artery disease include dyslipidemia. The current treatment provides moderate improvement. There are no compliance problems.  There is no history of angina, kidney disease, CAD/MI, CVA, heart failure, left ventricular hypertrophy, PVD or retinopathy. There is no history of chronic renal disease, a hypertension causing med or renovascular disease.  Hyperlipidemia This is a chronic problem. The current episode started more than 1 year ago. The problem is controlled. Recent lipid tests were reviewed and are normal. Exacerbating diseases include obesity. He has no history of chronic renal disease, diabetes, hypothyroidism or liver disease. Pertinent negatives include no chest pain, focal sensory loss, focal weakness, leg pain, myalgias or shortness of breath. Current antihyperlipidemic treatment includes statins.   Lab Results  Component Value Date   CREATININE 0.76 08/12/2019   BUN 14 08/12/2019   NA 142 08/12/2019   K 4.1 08/12/2019   CL 103 08/12/2019   CO2 22 08/12/2019   Lab Results  Component Value Date   CHOL 146 10/14/2019   HDL 31 (L) 10/14/2019   LDLCALC 80 10/14/2019   TRIG 208 (H) 10/14/2019   CHOLHDL 6.1 (H) 08/12/2019   No results found for: TSH Lab Results  Component Value Date   HGBA1C 5.8 (H) 08/12/2019   No results found for: WBC, HGB, HCT, MCV, PLT Lab Results  Component Value Date   ALT 47 (H) 10/14/2019    AST 39 10/14/2019   ALKPHOS 119 10/14/2019   BILITOT 0.5 10/14/2019     Review of Systems  Constitutional:  Negative for chills, fever and malaise/fatigue.  HENT:  Negative for drooling, ear discharge, ear pain and sore throat.   Eyes:  Negative for blurred vision.  Respiratory:  Negative for cough, shortness of breath and wheezing.   Cardiovascular:  Negative for chest pain, palpitations, orthopnea, leg swelling and PND.  Gastrointestinal:  Negative for abdominal pain, blood in stool, constipation, diarrhea and nausea.  Endocrine: Negative for polydipsia.  Genitourinary:  Negative for dysuria, frequency, hematuria and urgency.  Musculoskeletal:  Negative for back pain, myalgias and neck pain.  Skin:  Negative for rash.  Allergic/Immunologic: Negative for environmental allergies.  Neurological:  Negative for dizziness, focal weakness and headaches.  Hematological:  Does not bruise/bleed easily.  Psychiatric/Behavioral:  Negative for suicidal ideas. The patient is not nervous/anxious.    Patient Active Problem List   Diagnosis Date Noted   Hepatic cirrhosis (HCC) 08/12/2019    No Known Allergies  Past Surgical History:  Procedure Laterality Date   NO PAST SURGERIES      Social History   Tobacco Use   Smoking status: Former    Years: 8.00    Pack years: 0.00    Types: Cigarettes    Quit date: 1998    Years since quitting: 24.5   Smokeless tobacco: Never  Vaping Use   Vaping Use: Never used  Substance Use Topics   Alcohol use: Not Currently   Drug use: Not Currently     Medication  list has been reviewed and updated.  Current Meds  Medication Sig   aspirin EC 81 MG tablet Take 81 mg by mouth daily.   ezetimibe (ZETIA) 10 MG tablet Take 1 tablet (10 mg total) by mouth daily.   lisinopril-hydrochlorothiazide (ZESTORETIC) 20-25 MG tablet Take 1 tablet by mouth daily.   niacin 500 MG tablet Take 500 mg by mouth at bedtime. otc   Omega-3 Fatty Acids (FISH OIL)  1000 MG CPDR Take 1,200 mg by mouth in the morning and at bedtime.     PHQ 2/9 Scores 11/04/2020 08/12/2019 03/18/2019  PHQ - 2 Score 0 1 0  PHQ- 9 Score 0 1 0    GAD 7 : Generalized Anxiety Score 11/04/2020 08/12/2019 03/18/2019  Nervous, Anxious, on Edge 0 0 0  Control/stop worrying 0 0 0  Worry too much - different things 0 0 0  Trouble relaxing 0 0 0  Restless 0 0 0  Easily annoyed or irritable 0 0 0  Afraid - awful might happen 0 0 0  Total GAD 7 Score 0 0 0    BP Readings from Last 3 Encounters:  11/04/20 130/80  04/07/20 130/88  08/12/19 124/70    Physical Exam Vitals and nursing note reviewed.  HENT:     Head: Normocephalic.     Right Ear: Tympanic membrane, ear canal and external ear normal. There is no impacted cerumen.     Left Ear: Tympanic membrane, ear canal and external ear normal. There is no impacted cerumen.     Nose: Nose normal. No congestion or rhinorrhea.  Eyes:     General: No scleral icterus.       Right eye: No discharge.        Left eye: No discharge.     Conjunctiva/sclera: Conjunctivae normal.     Pupils: Pupils are equal, round, and reactive to light.  Neck:     Thyroid: No thyromegaly.     Vascular: No JVD.     Trachea: No tracheal deviation.  Cardiovascular:     Rate and Rhythm: Normal rate and regular rhythm.     Heart sounds: Normal heart sounds. No murmur heard.   No friction rub. No gallop.  Pulmonary:     Effort: No respiratory distress.     Breath sounds: Normal breath sounds. No wheezing, rhonchi or rales.  Abdominal:     General: Bowel sounds are normal.     Palpations: Abdomen is soft. There is no mass.     Tenderness: There is no abdominal tenderness. There is no guarding or rebound.  Musculoskeletal:        General: No tenderness. Normal range of motion.     Cervical back: Normal range of motion and neck supple.  Lymphadenopathy:     Cervical: No cervical adenopathy.  Skin:    General: Skin is warm.     Findings: No  rash.  Neurological:     Mental Status: He is alert and oriented to person, place, and time.     Cranial Nerves: No cranial nerve deficit.     Deep Tendon Reflexes: Reflexes are normal and symmetric.    Wt Readings from Last 3 Encounters:  11/04/20 (!) 326 lb (147.9 kg)  04/07/20 (!) 334 lb (151.5 kg)  08/12/19 (!) 319 lb (144.7 kg)    BP 130/80   Pulse 80   Ht 5\' 10"  (1.778 m)   Wt (!) 326 lb (147.9 kg)   BMI 46.78 kg/m  Assessment and Plan:  1. Mixed hyperlipidemia Chronic.  Controlled.  Stable.  Will likely continue Zetia 10 mg once a day.  And we will be checking a lipid panel for current LDL control. - ezetimibe (ZETIA) 10 MG tablet; Take 1 tablet (10 mg total) by mouth daily.  Dispense: 90 tablet; Refill: 1 - Lipid Panel With LDL/HDL Ratio  2. Essential hypertension Chronic.  Controlled.  Stable.  Blood pressure is 130/80.  We will continue lisinopril hydrochlorothiazide 20-25 mg once a day.  Will check CMP for electrolytes and GFR. - lisinopril-hydrochlorothiazide (ZESTORETIC) 20-25 MG tablet; Take 1 tablet by mouth daily.  Dispense: 90 tablet; Refill: 1 - Comprehensive Metabolic Panel (CMET)  3. Hepatic cirrhosis, unspecified hepatic cirrhosis type, unspecified whether ascites present (HCC) Chronic.  Questionable control.  Stable.  Patient has not been back to gastroenterology and we have made a return referral for follow-up for his hepatic cirrhosis concern. - Ambulatory referral to Gastroenterology

## 2020-11-05 LAB — LIPID PANEL WITH LDL/HDL RATIO
Cholesterol, Total: 197 mg/dL (ref 100–199)
HDL: 28 mg/dL — ABNORMAL LOW (ref >39)
LDL Chol Calc (NIH): 111 mg/dL — ABNORMAL HIGH (ref 0–99)
LDL/HDL Ratio: 4 ratio — ABNORMAL HIGH (ref 0.0–3.6)
Triglycerides: 337 mg/dL — ABNORMAL HIGH (ref 0–149)
VLDL Cholesterol Cal: 58 mg/dL — ABNORMAL HIGH (ref 5–40)

## 2020-11-05 LAB — COMPREHENSIVE METABOLIC PANEL
ALT: 59 IU/L — ABNORMAL HIGH (ref 0–44)
AST: 50 IU/L — ABNORMAL HIGH (ref 0–40)
Albumin/Globulin Ratio: 1.1 — ABNORMAL LOW (ref 1.2–2.2)
Albumin: 4.3 g/dL (ref 3.8–4.9)
Alkaline Phosphatase: 133 IU/L — ABNORMAL HIGH (ref 44–121)
BUN/Creatinine Ratio: 17 (ref 9–20)
BUN: 13 mg/dL (ref 6–24)
Bilirubin Total: 1.2 mg/dL (ref 0.0–1.2)
CO2: 24 mmol/L (ref 20–29)
Calcium: 9.6 mg/dL (ref 8.7–10.2)
Chloride: 95 mmol/L — ABNORMAL LOW (ref 96–106)
Creatinine, Ser: 0.78 mg/dL (ref 0.76–1.27)
Globulin, Total: 3.9 g/dL (ref 1.5–4.5)
Glucose: 180 mg/dL — ABNORMAL HIGH (ref 65–99)
Potassium: 4.1 mmol/L (ref 3.5–5.2)
Sodium: 136 mmol/L (ref 134–144)
Total Protein: 8.2 g/dL (ref 6.0–8.5)
eGFR: 108 mL/min/{1.73_m2} (ref >59)

## 2020-11-27 ENCOUNTER — Encounter: Payer: Self-pay | Admitting: Family Medicine

## 2020-12-31 ENCOUNTER — Ambulatory Visit: Payer: Self-pay | Admitting: Gastroenterology

## 2021-05-01 IMAGING — US US ABDOMEN LIMITED
1 series · 14 of 25 positions shown · non-contrast
Comparison: None.

CLINICAL DATA: Hepatomegaly.

EXAM:
ULTRASOUND ABDOMEN LIMITED RIGHT UPPER QUADRANT

[Series 1: us abdomen limited · 0.31mm/px · 14 of 46 slices shown]
[im 1/46]
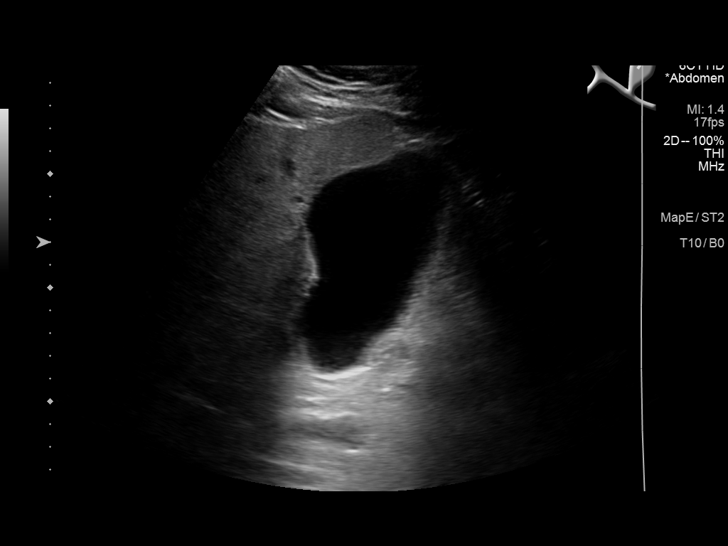
[im 4/46]
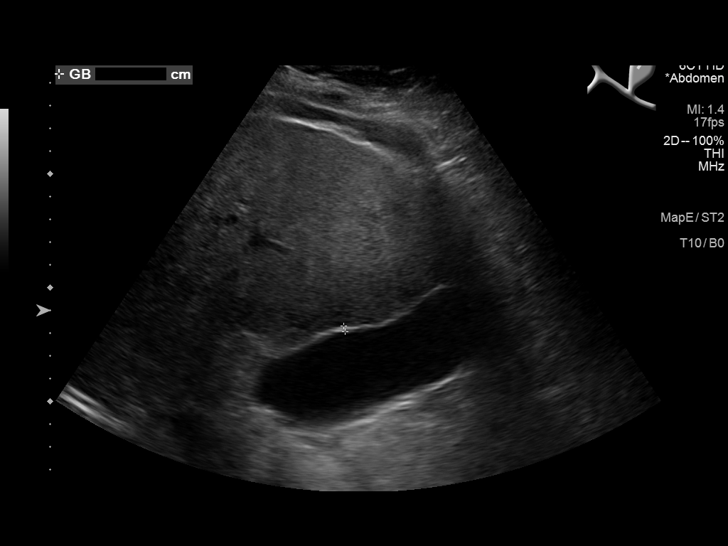
[im 8/46]
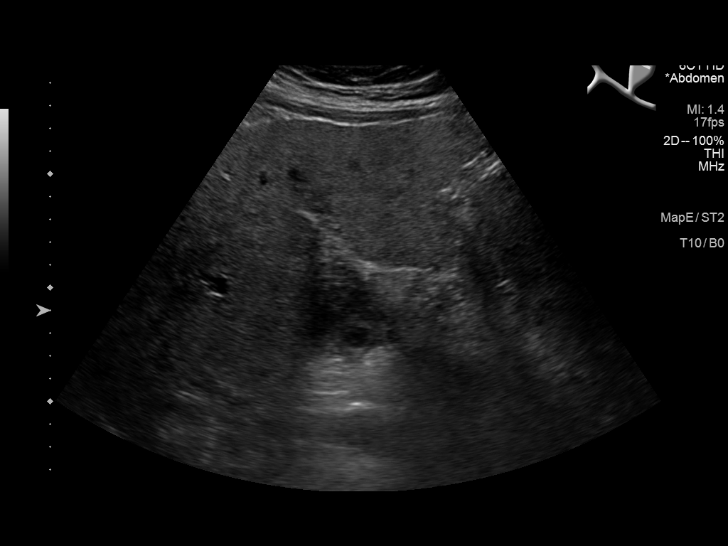
[im 12/46]
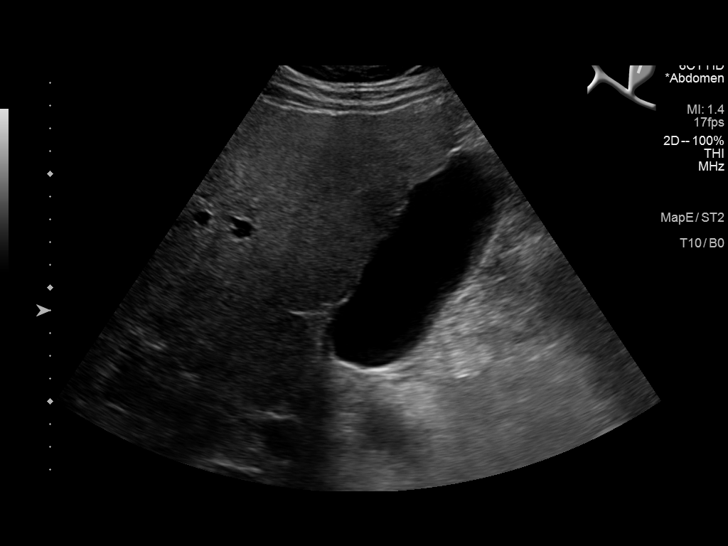
[im 16/46]
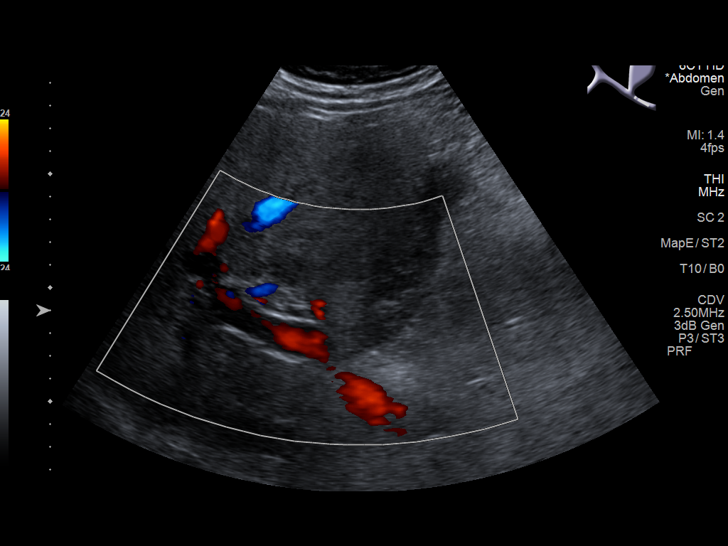
[im 17/46]
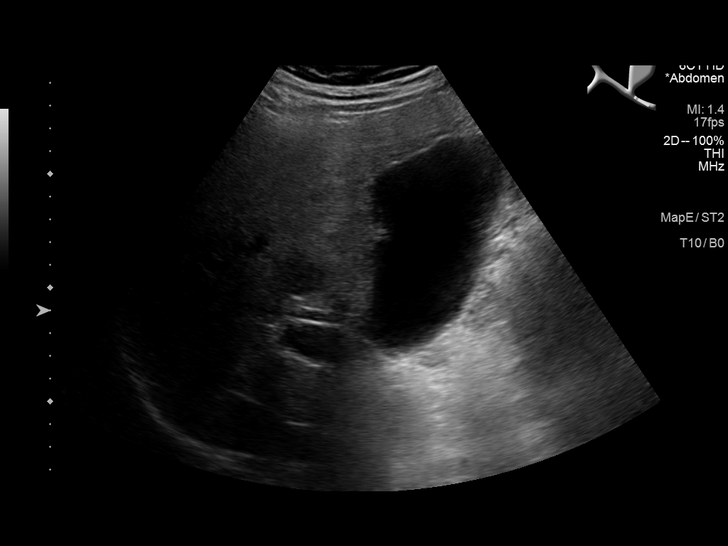
[im 21/46]
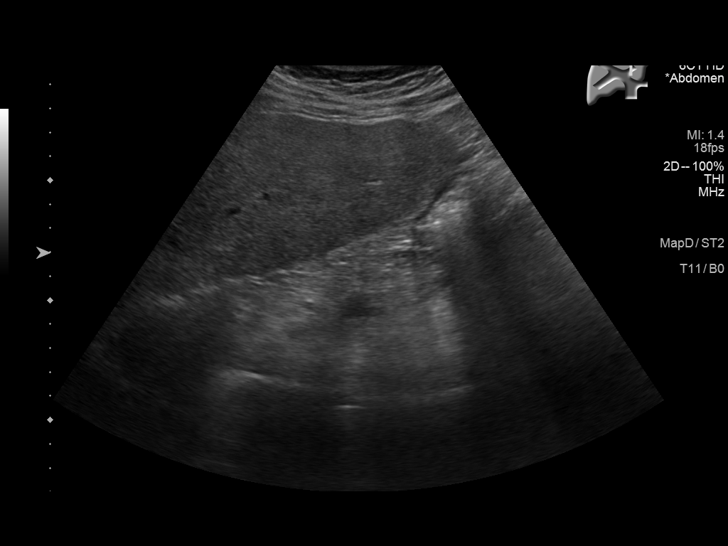
[im 25/46]
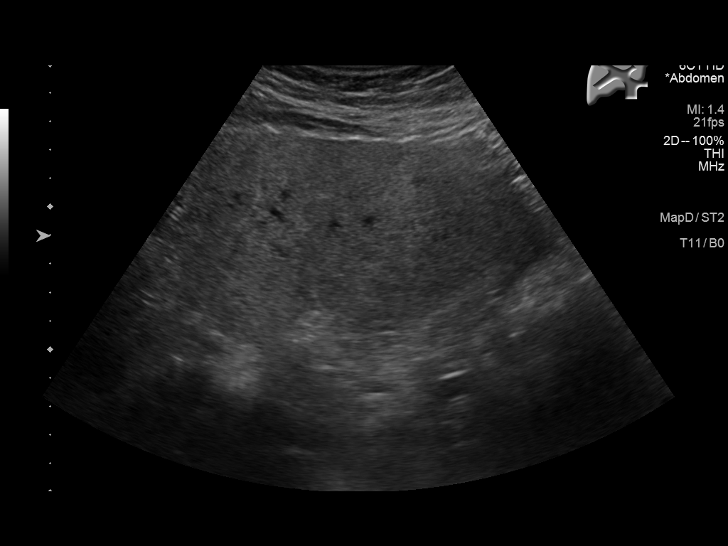
[im 29/46]
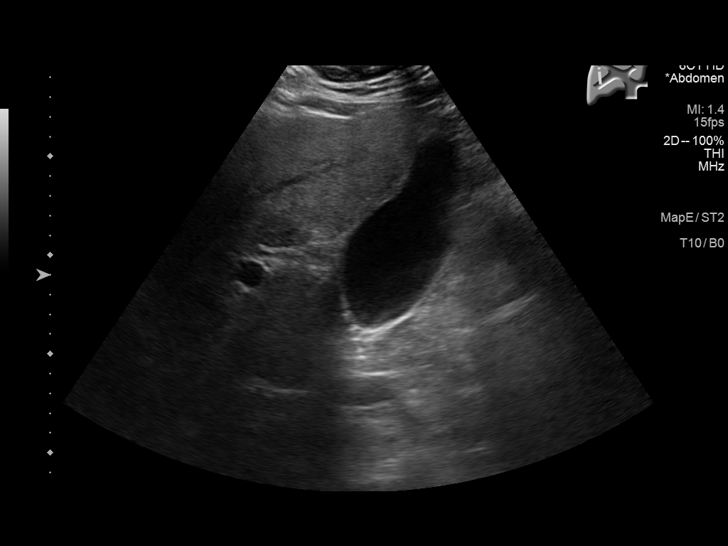
[im 31/46]
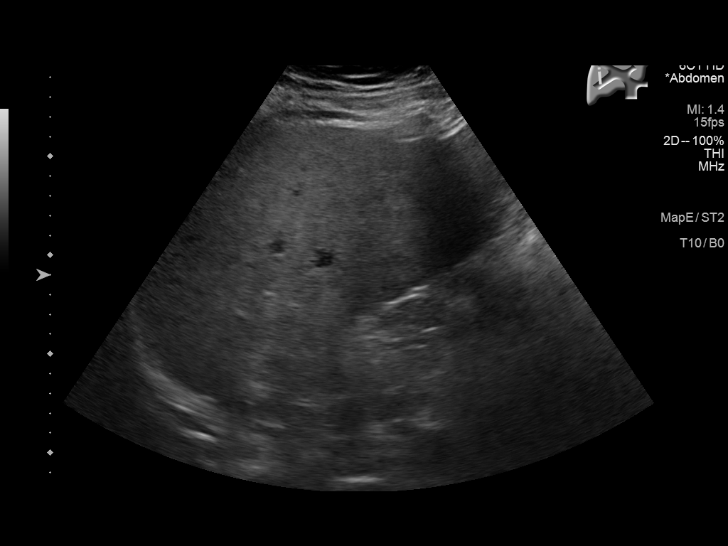
[im 34/46]
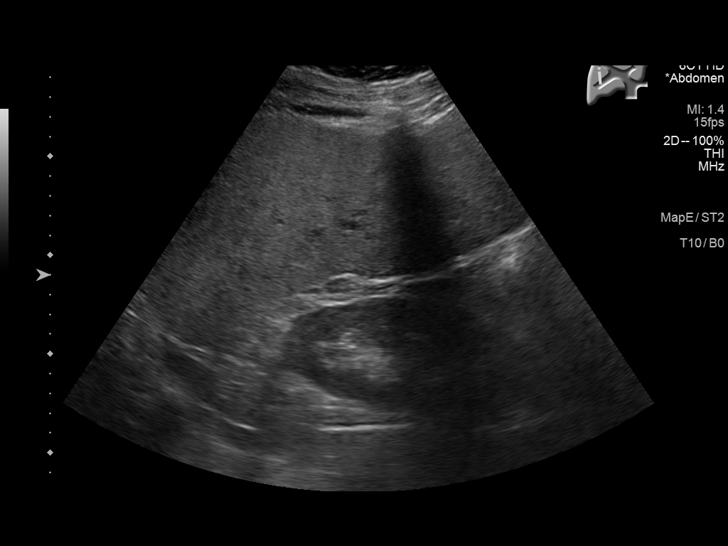
[im 38/46]
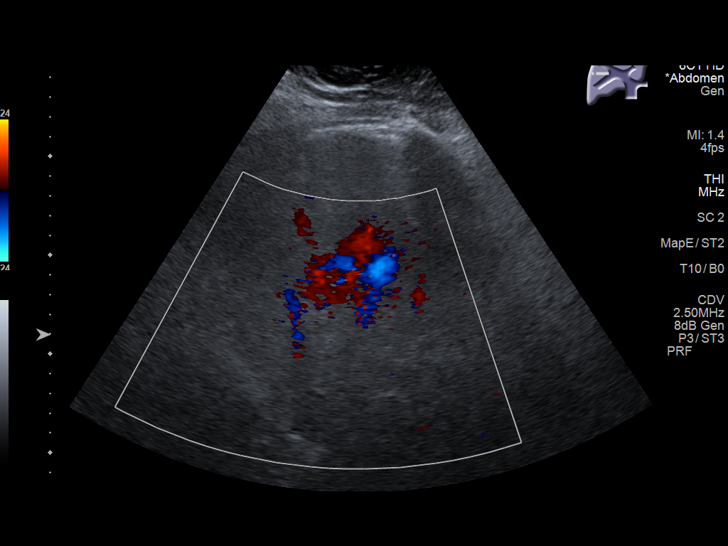
[im 42/46]
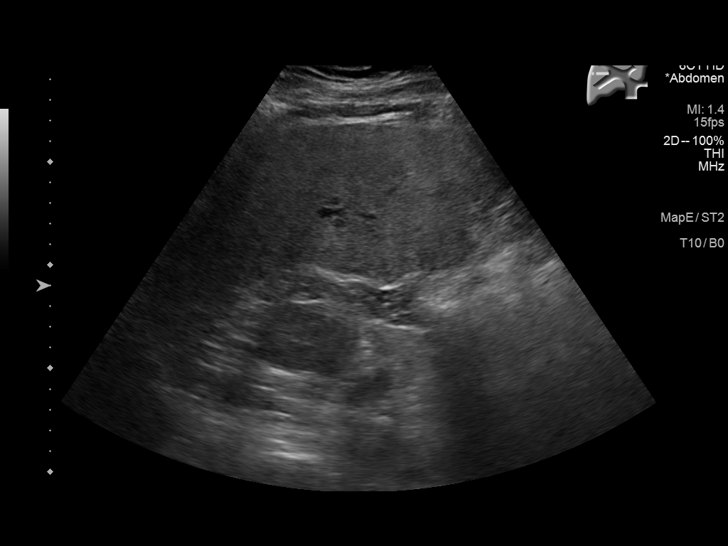
[im 46/46]
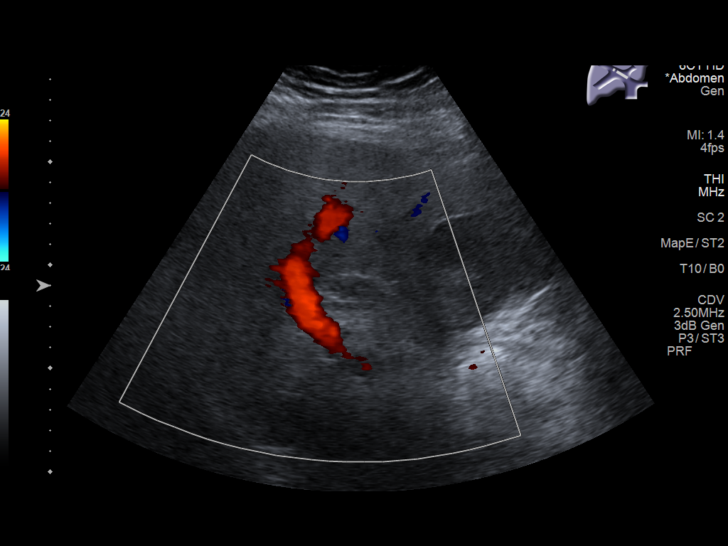

[14 of 25 positions shown; findings below may reference images not displayed]

FINDINGS: Gallbladder:

No gallstones or wall thickening visualized. No sonographic Murphy
sign noted by sonographer.

Common bile duct:

Diameter: 4.7 mm

Liver:

Increase hepatic echogenicity with nodular hepatic contour. Findings
suggest cirrhosis. Hepatomegaly cannot be excluded. Portal vein is
patent on color Doppler imaging with normal direction of blood flow
towards the liver.

Other: None.
IMPRESSION: 1. No gallstones or biliary distention. 2. Increase hepatic
echogenicity with nodular hepatic contour. Findings suggest
cirrhosis. Hepatomegaly cannot be excluded.

## 2021-05-29 IMAGING — US US ABDOMEN COMPLETE W/ ELASTOGRAPHY
1 series · 12 of 25 positions shown · non-contrast
Comparison: Limited abdominal ultrasound 04/01/2019.

CLINICAL DATA: Elevated liver function studies.  Cirrhosis.

EXAM:
ULTRASOUND ABDOMEN
ULTRASOUND HEPATIC ELASTOGRAPHY
TECHNIQUE: Sonography of the upper abdomen was performed. In addition,
ultrasound elastography evaluation of the liver was performed. A
region of interest was placed within the right lobe of the liver.
Following application of a compressive sonographic pulse, tissue
compressibility was assessed. Multiple assessments were performed at
the selected site. Median tissue compressibility was determined.
Previously, hepatic stiffness was assessed by shear wave velocity.
Based on recently published Society of Radiologists in Ultrasound
consensus article, reporting is now recommended to be performed in
the SI units of pressure (kiloPascals) representing hepatic
stiffness/elasticity. The obtained result is compared to the
published reference standards. (cACLD= compensated Advanced Chronic
Liver Disease)

[Series 1: us abdomen complete w/ elastography · 12 of 92 slices shown]
[im 4/92]
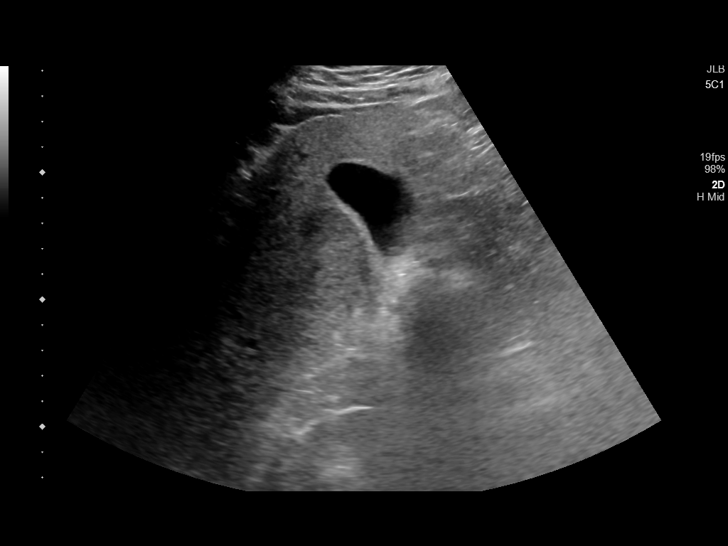
[im 12/92]
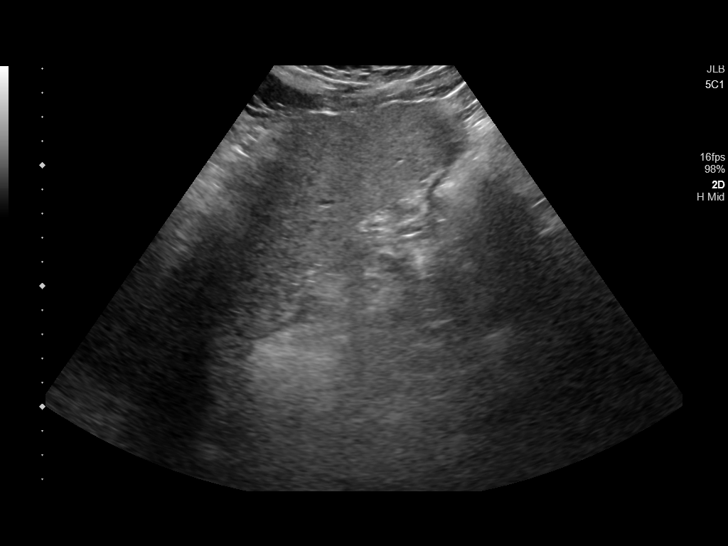
[im 19/92]
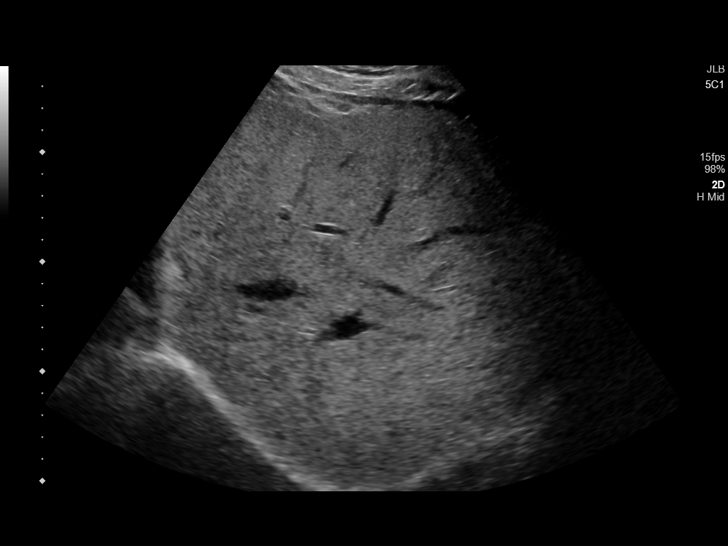
[im 27/92]
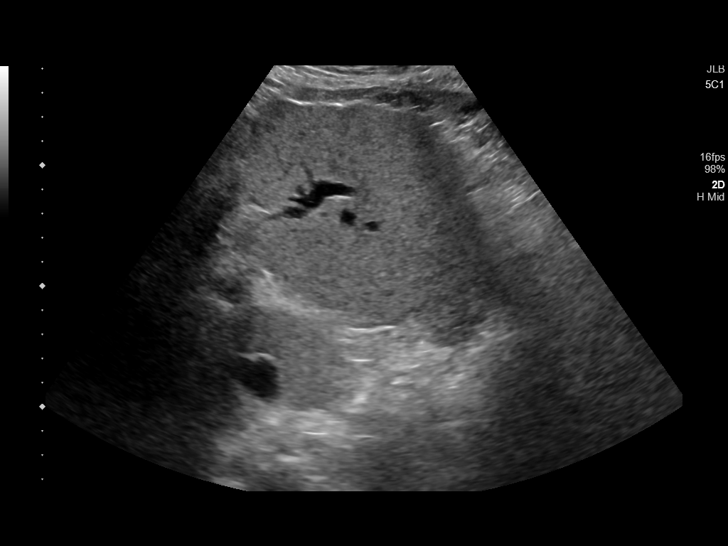
[im 35/92]
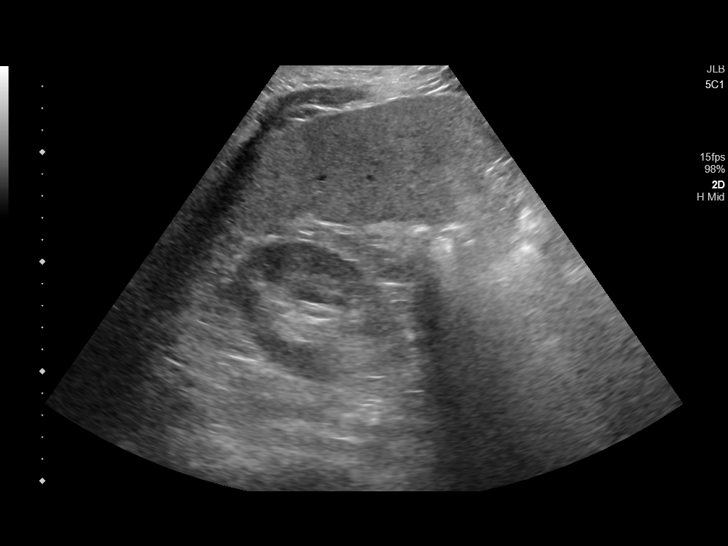
[im 42/92]
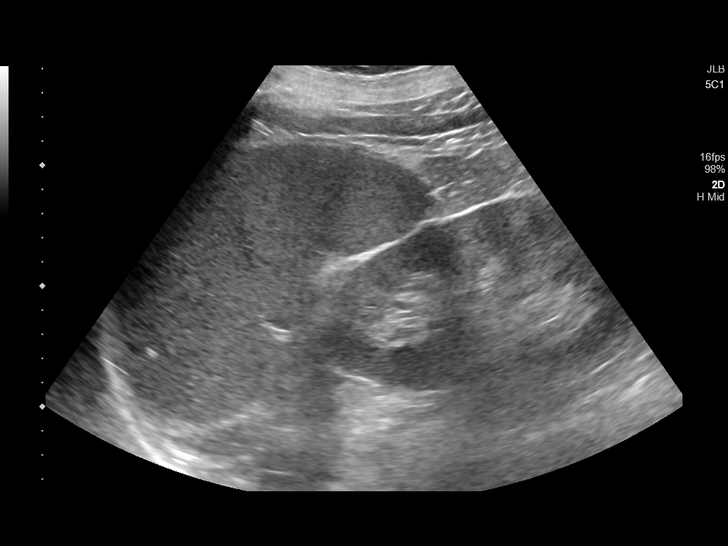
[im 50/92]
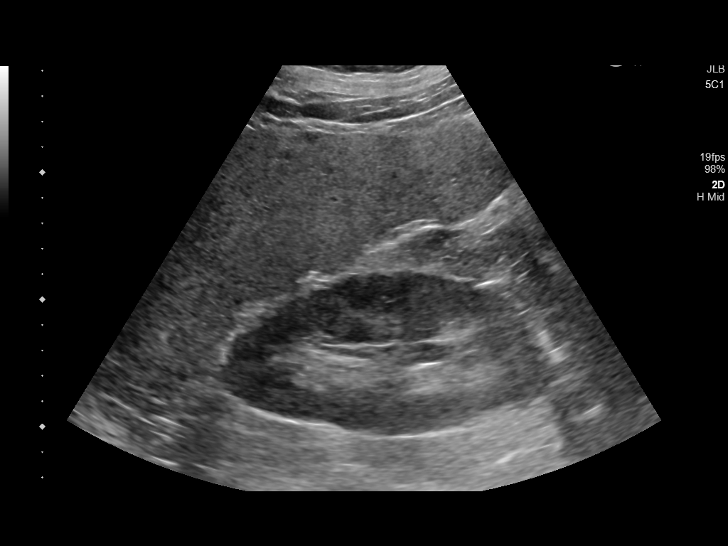
[im 57/92]
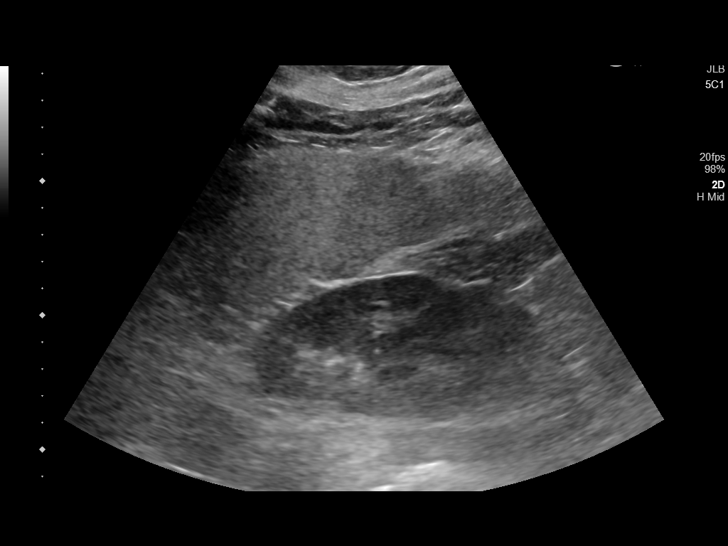
[im 65/92]
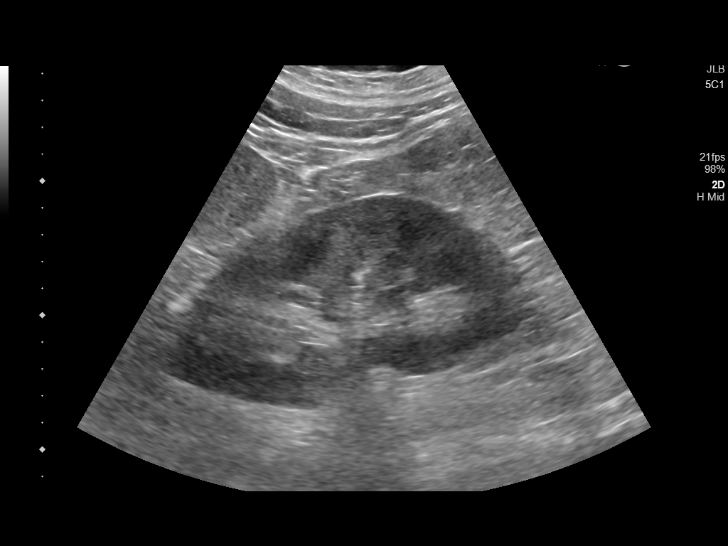
[im 73/92]
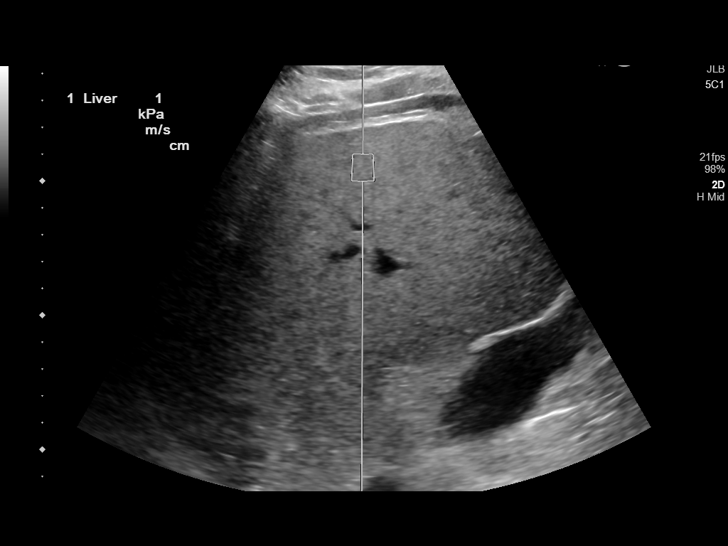
[im 80/92]
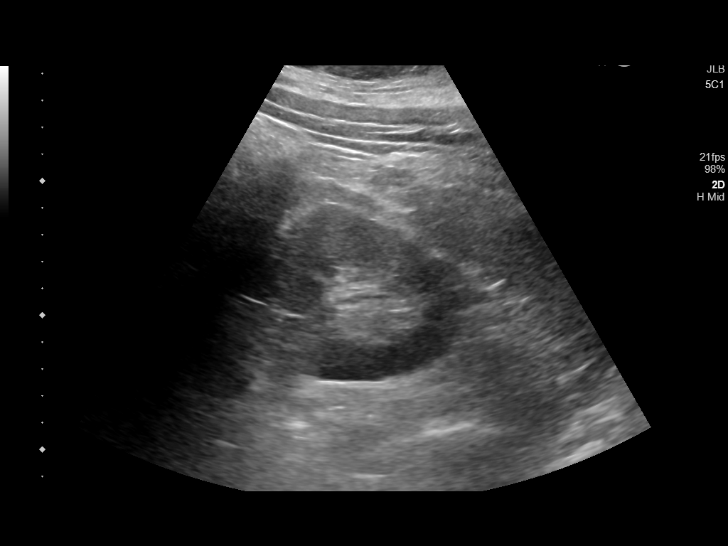
[im 88/92]
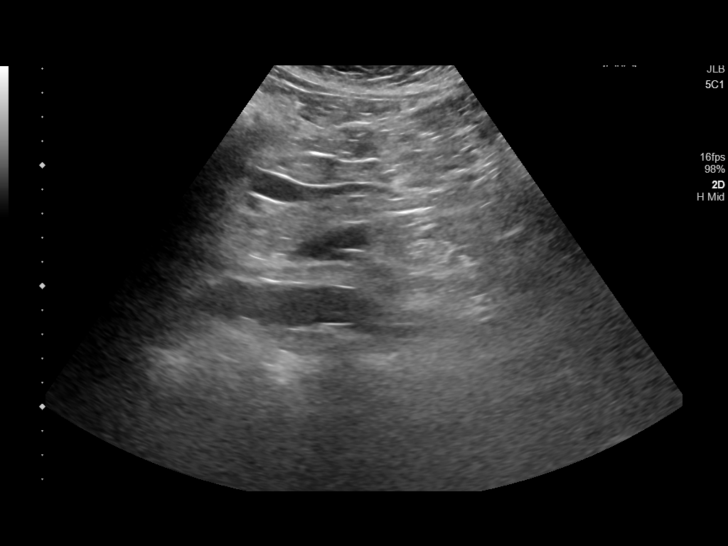

[12 of 25 positions shown; findings below may reference images not displayed]

FINDINGS: ULTRASOUND ABDOMEN

Gallbladder: No gallstones or wall thickening visualized. No
sonographic Murphy sign noted by sonographer.

Common bile duct: Diameter: 4 mm

Liver: Contour irregularity of the liver again noted consistent with
cirrhosis. No focal hepatic lesions are identified. Portal vein is
patent on color Doppler imaging with normal direction of blood flow
towards the liver.

IVC: No abnormality visualized.

Pancreas: Partially obscured by bowel gas. No abnormality
identified.

Spleen: Size and appearance within normal limits.

Right Kidney: Length: 12.7 cm. Echogenicity within normal limits. No
mass or hydronephrosis visualized.

Left Kidney: Length: 13.9 cm. Echogenicity within normal limits. No
mass or hydronephrosis visualized.

Abdominal aorta: No aneurysm visualized.

Other findings: No ascites.

ULTRASOUND HEPATIC ELASTOGRAPHY

Device: Siemens Helix VTQ

Patient position: Supine

Transducer [id number]

Number of measurements: 10

Hepatic segment:  8

Median kPa:

IQR:

IQR/Median kPa ratio:

Data quality:  Good

Diagnostic category: ?17 kPa: highly suggestive of cACLD with an
increased probability of clinically significant portal hypertension
IMPRESSION: ULTRASOUND ABDOMEN:

Stable morphologic changes of hepatic cirrhosis. No focal hepatic
abnormality or acute abdominal findings.

ULTRASOUND HEPATIC ELASTOGRAPHY:

Median kPa:

Diagnostic category: ?17 kPa: highly suggestive of cACLD with an
increased probability of clinically significant portal hypertension

The use of hepatic elastography is applicable to patients with viral
hepatitis and non-alcoholic fatty liver disease. At this time, there
is insufficient data for the referenced cut-off values and use in
other causes of liver disease, including alcoholic liver disease.
Patients, however, may be assessed by elastography and serve as
their own reference standard/baseline.

In patients with non-alcoholic liver disease, the values suggesting
compensated advanced chronic liver disease (cACLD) may be lower, and
patients may need additional testing with elasticity results of [DATE]
kPa.

Please note that abnormal hepatic elasticity and shear wave
velocities may also be identified in clinical settings other than
with hepatic fibrosis, such as: acute hepatitis, elevated right
heart and central venous pressures including use of beta blockers,
Zeinab disease (Cetintas), infiltrative processes such as
mastocytosis/amyloidosis/infiltrative tumor/lymphoma, extrahepatic
cholestasis, with hyperemia in the post-prandial state, and with
liver transplantation. Correlation with patient history, laboratory
data, and clinical condition recommended.

Diagnostic Categories:

?5 kPa: high probability of being normal

?9 kPa: in the absence of other known clinical signs, rules [DATE] kPa and ?13 kPa: suggestive of cACLD, but needs further testing

>13 kPa: highly suggestive of cACLD

?17 kPa: highly suggestive of cACLD with an increased probability of
clinically significant portal hypertension

## 2021-05-31 ENCOUNTER — Other Ambulatory Visit: Payer: Self-pay

## 2021-05-31 ENCOUNTER — Ambulatory Visit: Payer: 59 | Admitting: Family Medicine

## 2021-05-31 ENCOUNTER — Encounter: Payer: Self-pay | Admitting: Family Medicine

## 2021-05-31 VITALS — BP 122/82 | HR 80 | Ht 70.0 in | Wt 326.0 lb

## 2021-05-31 DIAGNOSIS — I10 Essential (primary) hypertension: Secondary | ICD-10-CM | POA: Diagnosis not present

## 2021-05-31 DIAGNOSIS — R7303 Prediabetes: Secondary | ICD-10-CM | POA: Diagnosis not present

## 2021-05-31 DIAGNOSIS — E782 Mixed hyperlipidemia: Secondary | ICD-10-CM | POA: Diagnosis not present

## 2021-05-31 MED ORDER — LISINOPRIL-HYDROCHLOROTHIAZIDE 20-25 MG PO TABS: 1.0000 | ORAL_TABLET | Freq: Every day | ORAL | 1 refills | Status: DC

## 2021-05-31 MED ORDER — EZETIMIBE 10 MG PO TABS: 10.0000 mg | ORAL_TABLET | Freq: Every day | ORAL | 1 refills | Status: DC

## 2021-05-31 NOTE — Progress Notes (Signed)
Date:  05/31/2021   Name:  Joshua Shaw   DOB:  [birth year]   MRN:  [id number]   Chief Complaint: Hyperlipidemia, Hypertension, and Prediabetes  Hyperlipidemia This is a chronic problem. The current episode started more than 1 year ago. The problem is controlled. Recent lipid tests were reviewed and are normal. He has no history of chronic renal disease, diabetes, hypothyroidism, liver disease, obesity or nephrotic syndrome. There are no known factors aggravating his hyperlipidemia. Pertinent negatives include no chest pain, focal sensory loss, focal weakness, leg pain, myalgias or shortness of breath. Current antihyperlipidemic treatment includes statins. The current treatment provides moderate improvement of lipids. There are no compliance problems.  There are no known risk factors for coronary artery disease.  Hypertension This is a chronic problem. The current episode started more than 1 year ago. The problem has been waxing and waning since onset. The problem is controlled. Pertinent negatives include no anxiety, blurred vision, chest pain, headaches, malaise/fatigue, neck pain, orthopnea, palpitations, peripheral edema, shortness of breath or sweats. Past treatments include ACE inhibitors and diuretics. The current treatment provides moderate improvement. There are no compliance problems.  There is no history of angina, kidney disease, CAD/MI, CVA, heart failure, left ventricular hypertrophy, PVD or retinopathy. There is no history of chronic renal disease, a hypertension causing med or renovascular disease.  Diabetes He presents for his follow-up diabetic visit. He has type 2 diabetes mellitus. His disease course has been stable. Pertinent negatives for hypoglycemia include no dizziness, headaches, nervousness/anxiousness or sweats. There are no diabetic associated symptoms. Pertinent negatives for diabetes include no blurred vision, no chest pain, no fatigue, no foot paresthesias, no  foot ulcerations, no polydipsia, no polyphagia, no polyuria, no visual change, no weakness and no weight loss. There are no hypoglycemic complications. Symptoms are stable. There are no diabetic complications. Pertinent negatives for diabetic complications include no CVA, PVD or retinopathy. There are no known risk factors for coronary artery disease. When asked about current treatments, none were reported. He is compliant with treatment most of the time. He is following a generally healthy diet. Meal planning includes avoidance of concentrated sweets and carbohydrate counting. He participates in exercise intermittently. His breakfast blood glucose is taken between 8-9 am. An ACE inhibitor/angiotensin II receptor blocker is being taken.   Lab Results  Component Value Date   NA 136 11/04/2020   K 4.1 11/04/2020   CO2 24 11/04/2020   GLUCOSE 180 (H) 11/04/2020   BUN 13 11/04/2020   CREATININE 0.78 11/04/2020   CALCIUM 9.6 11/04/2020   EGFR 108 11/04/2020   GFRNONAA 107 08/12/2019   Lab Results  Component Value Date   CHOL 197 11/04/2020   HDL 28 (L) 11/04/2020   LDLCALC 111 (H) 11/04/2020   TRIG 337 (H) 11/04/2020   CHOLHDL 6.1 (H) 08/12/2019   No results found for: TSH Lab Results  Component Value Date   HGBA1C 5.8 (H) 08/12/2019   No results found for: WBC, HGB, HCT, MCV, PLT Lab Results  Component Value Date   ALT 59 (H) 11/04/2020   AST 50 (H) 11/04/2020   ALKPHOS 133 (H) 11/04/2020   BILITOT 1.2 11/04/2020   No results found for: 25OHVITD2, 25OHVITD3, VD25OH   Review of Systems  Constitutional:  Negative for chills, fatigue, fever, malaise/fatigue and weight loss.  HENT:  Negative for drooling, ear discharge, ear pain and sore throat.   Eyes:  Negative for blurred vision.  Respiratory:  Negative for cough, shortness  of breath and wheezing.   Cardiovascular:  Negative for chest pain, palpitations, orthopnea and leg swelling.  Gastrointestinal:  Negative for abdominal  pain, blood in stool, constipation, diarrhea and nausea.  Endocrine: Negative for polydipsia, polyphagia and polyuria.  Genitourinary:  Negative for dysuria, frequency, hematuria and urgency.  Musculoskeletal:  Negative for back pain, myalgias and neck pain.  Skin:  Negative for rash.  Allergic/Immunologic: Negative for environmental allergies.  Neurological:  Negative for dizziness, focal weakness, weakness and headaches.  Hematological:  Does not bruise/bleed easily.  Psychiatric/Behavioral:  Negative for suicidal ideas. The patient is not nervous/anxious.    Patient Active Problem List   Diagnosis Date Noted   Hepatic cirrhosis ([place]) 08/12/2019    No Known Allergies  Past Surgical History:  Procedure Laterality Date   NO PAST SURGERIES      Social History   Tobacco Use   Smoking status: Former    Years: 8.00    Types: Cigarettes    Quit date: 1998    Years since quitting: 25.1   Smokeless tobacco: Never  Vaping Use   Vaping Use: Never used  Substance Use Topics   Alcohol use: Not Currently   Drug use: Not Currently     Medication list has been reviewed and updated.  Current Meds  Medication Sig   aspirin EC 81 MG tablet Take 81 mg by mouth daily.   ezetimibe (ZETIA) 10 MG tablet Take 1 tablet (10 mg total) by mouth daily.   lisinopril-hydrochlorothiazide (ZESTORETIC) 20-25 MG tablet Take 1 tablet by mouth daily.   niacin 500 MG tablet Take 500 mg by mouth at bedtime. otc   Omega-3 Fatty Acids (FISH OIL) 1000 MG CPDR Take 1,200 mg by mouth in the morning and at bedtime.     PHQ 2/9 Scores 05/31/2021 11/04/2020 08/12/2019 03/18/2019  PHQ - 2 Score 0 0 1 0  PHQ- 9 Score 0 0 1 0    GAD 7 : Generalized Anxiety Score 05/31/2021 11/04/2020 08/12/2019 03/18/2019  Nervous, Anxious, on Edge 0 0 0 0  Control/stop worrying 0 0 0 0  Worry too much - different things 0 0 0 0  Trouble relaxing 0 0 0 0  Restless 0 0 0 0  Easily annoyed or irritable 0 0 0 0  Afraid - awful  might happen 0 0 0 0  Total GAD 7 Score 0 0 0 0  Anxiety Difficulty Not difficult at all - - -    BP Readings from Last 3 Encounters:  05/31/21 122/82  11/04/20 130/80  04/07/20 130/88    Physical Exam Vitals and nursing note reviewed.  HENT:     Head: Normocephalic.     Right Ear: Tympanic membrane and external ear normal. There is no impacted cerumen.     Left Ear: Tympanic membrane and external ear normal. There is no impacted cerumen.     Nose: Nose normal. No congestion or rhinorrhea.  Eyes:     General: No scleral icterus.       Right eye: No discharge.        Left eye: No discharge.     Conjunctiva/sclera: Conjunctivae normal.     Pupils: Pupils are equal, round, and reactive to light.  Neck:     Thyroid: No thyromegaly.     Vascular: No JVD.     Trachea: No tracheal deviation.  Cardiovascular:     Rate and Rhythm: Normal rate and regular rhythm.     Heart sounds: Normal  heart sounds. No murmur heard.   No friction rub. No gallop.  Pulmonary:     Effort: No respiratory distress.     Breath sounds: Normal breath sounds. No wheezing, rhonchi or rales.  Chest:     Chest wall: No tenderness.  Abdominal:     General: Bowel sounds are normal.     Palpations: Abdomen is soft. There is no mass.     Tenderness: There is no abdominal tenderness. There is no guarding or rebound.  Musculoskeletal:        General: No tenderness. Normal range of motion.     Cervical back: Normal range of motion and neck supple.  Lymphadenopathy:     Cervical: No cervical adenopathy.  Skin:    General: Skin is warm.     Findings: No bruising, erythema or rash.  Neurological:     Mental Status: He is alert and oriented to person, place, and time.     Cranial Nerves: No cranial nerve deficit.     Deep Tendon Reflexes: Reflexes are normal and symmetric.    Wt Readings from Last 3 Encounters:  05/31/21 (!) 326 lb (147.9 kg)  11/04/20 (!) 326 lb (147.9 kg)  04/07/20 (!) 334 lb (151.5 kg)     BP 122/82    Pulse 80    Ht _0  (1.778 m)    Wt (!) 326 lb (147.9 kg)    BMI 46.78 kg/m   Assessment and Plan:  1. Essential hypertension Chronic.  Controlled.  Stable.  Blood pressure today is 122/82.  Continue lisinopril hydrochlorothiazide 20-25.  Will check renal function panel for current status of electrolytes and GFR. - lisinopril-hydrochlorothiazide (ZESTORETIC) 20-25 MG tablet; Take 1 tablet by mouth daily.  Dispense: 90 tablet; Refill: 1 - Renal Function Panel  2. Mixed hyperlipidemia Chronic.  Controlled.  Stable.  Continue Zetia 10 mg once a day.  Will check lipid panel for current level of LDL control. - ezetimibe (ZETIA) 10 MG tablet; Take 1 tablet (10 mg total) by mouth daily.  Dispense: 90 tablet; Refill: 1 - Lipid Panel With LDL/HDL Ratio  3. Prediabetes Chronic.  Controlled.  Patient is only instituting a diet and is questionable at this time.  Will check A1c to see if you are still in diabetic range. - HgB A1c  4. Obesity, morbid, BMI 40.0-49.9 ([place]) Health risks of being over weight were discussed and patient was counseled on weight loss options and exercise.  Patient has been given Dash diet to assist with weight reduction.

## 2021-05-31 NOTE — Patient Instructions (Signed)

## 2021-06-09 ENCOUNTER — Telehealth: Payer: Self-pay

## 2021-06-09 NOTE — Telephone Encounter (Signed)
I called pt to tell him he needs to get his labs that were ordered on 2/6- could not leave message d/t mailbox not set up

## 2021-11-05 ENCOUNTER — Encounter: Payer: Self-pay | Admitting: Family Medicine

## 2021-11-29 ENCOUNTER — Ambulatory Visit: Payer: 59 | Admitting: Family Medicine

## 2021-12-21 ENCOUNTER — Ambulatory Visit: Payer: 59 | Admitting: Family Medicine

## 2021-12-28 ENCOUNTER — Ambulatory Visit: Payer: 59 | Admitting: Family Medicine

## 2021-12-28 ENCOUNTER — Encounter: Payer: Self-pay | Admitting: Family Medicine

## 2021-12-28 VITALS — BP 138/80 | HR 72 | Ht 70.0 in | Wt 324.0 lb

## 2021-12-28 DIAGNOSIS — R7303 Prediabetes: Secondary | ICD-10-CM

## 2021-12-28 DIAGNOSIS — E782 Mixed hyperlipidemia: Secondary | ICD-10-CM | POA: Diagnosis not present

## 2021-12-28 DIAGNOSIS — K7460 Unspecified cirrhosis of liver: Secondary | ICD-10-CM | POA: Diagnosis not present

## 2021-12-28 DIAGNOSIS — I10 Essential (primary) hypertension: Secondary | ICD-10-CM | POA: Diagnosis not present

## 2021-12-28 MED ORDER — LISINOPRIL-HYDROCHLOROTHIAZIDE 20-25 MG PO TABS: 1.0000 | ORAL_TABLET | Freq: Every day | ORAL | 1 refills | Status: DC

## 2021-12-28 MED ORDER — EZETIMIBE 10 MG PO TABS: 10.0000 mg | ORAL_TABLET | Freq: Every day | ORAL | 1 refills | Status: DC

## 2021-12-28 NOTE — Progress Notes (Signed)
Date:  12/28/2021   Name:  Joshua Shaw   DOB:  26-Sep-1970   MRN:  [id number]   Chief Complaint: Hyperlipidemia, Hypertension, and Prediabetes  Hyperlipidemia This is a chronic problem. The current episode started more than 1 year ago. The problem is controlled. Recent lipid tests were reviewed and are normal. He has no history of chronic renal disease, diabetes, hypothyroidism, liver disease, obesity or nephrotic syndrome. There are no known factors aggravating his hyperlipidemia. Pertinent negatives include no chest pain, focal sensory loss, focal weakness, leg pain, myalgias or shortness of breath. Current antihyperlipidemic treatment includes ezetimibe. The current treatment provides moderate improvement of lipids. There are no compliance problems.  There are no known risk factors for coronary artery disease.  Hypertension This is a chronic problem. The current episode started more than 1 year ago. The problem has been gradually improving since onset. The problem is controlled. Pertinent negatives include no blurred vision, chest pain, orthopnea, palpitations, PND or shortness of breath. There are no associated agents to hypertension. Past treatments include ACE inhibitors and diuretics. The current treatment provides mild improvement. There are no compliance problems.  There is no history of chronic renal disease.  Diabetes He presents for his follow-up diabetic visit. Diabetes type: prediabetes. His disease course has been stable. There are no hypoglycemic associated symptoms. Pertinent negatives for diabetes include no blurred vision, no chest pain, no fatigue, no polydipsia, no polyphagia and no polyuria. There are no hypoglycemic complications. Symptoms are stable. There are no diabetic complications.    Lab Results  Component Value Date   NA 136 11/04/2020   K 4.1 11/04/2020   CO2 24 11/04/2020   GLUCOSE 180 (H) 11/04/2020   BUN 13 11/04/2020   CREATININE 0.78 11/04/2020    CALCIUM 9.6 11/04/2020   EGFR 108 11/04/2020   GFRNONAA 107 08/12/2019   Lab Results  Component Value Date   CHOL 197 11/04/2020   HDL 28 (L) 11/04/2020   LDLCALC 111 (H) 11/04/2020   TRIG 337 (H) 11/04/2020   CHOLHDL 6.1 (H) 08/12/2019   No results found for: "TSH" Lab Results  Component Value Date   HGBA1C 5.8 (H) 08/12/2019   No results found for: "WBC", "HGB", "HCT", "MCV", "PLT" Lab Results  Component Value Date   ALT 59 (H) 11/04/2020   AST 50 (H) 11/04/2020   ALKPHOS 133 (H) 11/04/2020   BILITOT 1.2 11/04/2020   No results found for: "25OHVITD2", "25OHVITD3", "VD25OH"   Review of Systems  Constitutional:  Negative for fatigue.  Eyes:  Negative for blurred vision.  Respiratory:  Negative for shortness of breath.   Cardiovascular:  Negative for chest pain, palpitations, orthopnea and PND.  Endocrine: Negative for polydipsia, polyphagia and polyuria.  Musculoskeletal:  Negative for myalgias.  Neurological:  Negative for focal weakness.    Patient Active Problem List   Diagnosis Date Noted   Hepatic cirrhosis ([place]) 08/12/2019    No Known Allergies  Past Surgical History:  Procedure Laterality Date   NO PAST SURGERIES      Social History   Tobacco Use   Smoking status: Former    Years: 8.00    Types: Cigarettes    Quit date: 1998    Years since quitting: 25.6   Smokeless tobacco: Never  Vaping Use   Vaping Use: Never used  Substance Use Topics   Alcohol use: Not Currently   Drug use: Not Currently     Medication list has been reviewed and updated.  Current Meds  Medication Sig   aspirin EC 81 MG tablet Take 81 mg by mouth daily.   ezetimibe (ZETIA) 10 MG tablet Take 1 tablet (10 mg total) by mouth daily.   lisinopril-hydrochlorothiazide (ZESTORETIC) 20-25 MG tablet Take 1 tablet by mouth daily.   niacin 500 MG tablet Take 500 mg by mouth at bedtime. otc   Omega-3 Fatty Acids (FISH OIL) 1000 MG CPDR Take 1,200 mg by mouth in the morning and  at bedtime.        12/28/2021    2:47 PM 05/31/2021    1:24 PM 11/04/2020   10:15 AM 08/12/2019    9:14 AM  GAD 7 : Generalized Anxiety Score  Nervous, Anxious, on Edge 0 0 0 0  Control/stop worrying 0 0 0 0  Worry too much - different things 0 0 0 0  Trouble relaxing 0 0 0 0  Restless 0 0 0 0  Easily annoyed or irritable 0 0 0 0  Afraid - awful might happen 0 0 0 0  Total GAD 7 Score 0 0 0 0  Anxiety Difficulty Not difficult at all Not difficult at all         12/28/2021    2:47 PM 05/31/2021    1:23 PM 11/04/2020   10:15 AM  Depression screen PHQ 2/9  Decreased Interest 0 0 0  Down, Depressed, Hopeless 0 0 0  PHQ - 2 Score 0 0 0  Altered sleeping 0 0 0  Tired, decreased energy 0 0 0  Change in appetite 0 0 0  Feeling bad or failure about yourself  0 0 0  Trouble concentrating 0 0 0  Moving slowly or fidgety/restless 0 0 0  Suicidal thoughts 0 0 0  PHQ-9 Score 0 0 0  Difficult doing work/chores Not difficult at all Not difficult at all     BP Readings from Last 3 Encounters:  12/28/21 138/80  05/31/21 122/82  11/04/20 130/80    Physical Exam Vitals and nursing note reviewed.  HENT:     Head: Normocephalic.     Right Ear: Tympanic membrane and external ear normal.     Left Ear: Tympanic membrane and external ear normal.     Nose: Nose normal.  Eyes:     General: No scleral icterus.       Right eye: No discharge.        Left eye: No discharge.     Conjunctiva/sclera: Conjunctivae normal.     Pupils: Pupils are equal, round, and reactive to light.  Neck:     Thyroid: No thyromegaly.     Vascular: No JVD.     Trachea: No tracheal deviation.  Cardiovascular:     Rate and Rhythm: Normal rate and regular rhythm.     Heart sounds: Normal heart sounds, S1 normal and S2 normal. No murmur heard.    No systolic murmur is present.     No diastolic murmur is present.     No friction rub. No gallop. No S3 or S4 sounds.  Pulmonary:     Effort: No respiratory distress.      Breath sounds: Normal breath sounds. No wheezing or rales.  Abdominal:     General: Bowel sounds are normal.     Palpations: Abdomen is soft. There is no mass.     Tenderness: There is no abdominal tenderness. There is no guarding or rebound.  Musculoskeletal:        General: No tenderness. Normal  range of motion.     Cervical back: Normal range of motion and neck supple.  Lymphadenopathy:     Cervical: No cervical adenopathy.  Skin:    General: Skin is warm.     Findings: No rash.  Neurological:     Mental Status: He is alert.     Wt Readings from Last 3 Encounters:  12/28/21 (!) 324 lb (147 kg)  05/31/21 (!) 326 lb (147.9 kg)  11/04/20 (!) 326 lb (147.9 kg)    BP 138/80   Pulse 72   Ht 5' 10"  (1.778 m)   Wt (!) 324 lb (147 kg)   BMI 46.49 kg/m   Assessment and Plan:  1. Essential hypertension Chronic.  Controlled.  Stable.  Blood pressure today is 138/80.  Continue lisinopril hydrochlorothiazide 20-25 mg once a day.  Will check CMP for electrolytes and GFR. - lisinopril-hydrochlorothiazide (ZESTORETIC) 20-25 MG tablet; Take 1 tablet by mouth daily.  Dispense: 90 tablet; Refill: 1 - Comprehensive Metabolic Panel (CMET)  2. Mixed hyperlipidemia Chronic.  Controlled.  Stable.  Continue Zetia 10 mg once a day.  Will check lipid panel for current level of LDL control. - ezetimibe (ZETIA) 10 MG tablet; Take 1 tablet (10 mg total) by mouth daily.  Dispense: 90 tablet; Refill: 1 - Lipid Panel With LDL/HDL Ratio  3. Prediabetes Chronic.  Control uncertain.  2 years ago it was noted that A1c was at 5.8.  Patient has not been rechecked since then we will check an A1c to see if there is been any further progression. - HgB A1c  4. Hepatic cirrhosis, unspecified hepatic cirrhosis type, unspecified whether ascites present (HCC) Chronic.  Control uncertain.  Patient has not been back to gastroenterology for follow-up with [facility] gastroenterology/[facility].  Patient  has hepatic cirrhosis of unknown type.  Patient probably needs elastography and ultrasound and needs to be reevaluated by gastroenterology at this time. - Comprehensive Metabolic Panel (CMET) - Ambulatory referral to Gastroenterology    Otilio Miu, MD

## 2021-12-29 ENCOUNTER — Other Ambulatory Visit: Payer: Self-pay

## 2021-12-29 DIAGNOSIS — E119 Type 2 diabetes mellitus without complications: Secondary | ICD-10-CM

## 2021-12-29 LAB — LIPID PANEL WITH LDL/HDL RATIO
Cholesterol, Total: 240 mg/dL — ABNORMAL HIGH (ref 100–199)
HDL: 25 mg/dL — ABNORMAL LOW (ref >39)
LDL Chol Calc (NIH): 88 mg/dL (ref 0–99)
LDL/HDL Ratio: 3.5 ratio (ref 0.0–3.6)
Triglycerides: 766 mg/dL (ref 0–149)
VLDL Cholesterol Cal: 127 mg/dL — ABNORMAL HIGH (ref 5–40)

## 2021-12-29 LAB — COMPREHENSIVE METABOLIC PANEL
ALT: 56 IU/L — ABNORMAL HIGH (ref 0–44)
AST: 47 IU/L — ABNORMAL HIGH (ref 0–40)
Albumin/Globulin Ratio: 1.1 — ABNORMAL LOW (ref 1.2–2.2)
Albumin: 4.3 g/dL (ref 3.8–4.9)
Alkaline Phosphatase: 145 IU/L — ABNORMAL HIGH (ref 44–121)
BUN/Creatinine Ratio: 10 (ref 9–20)
BUN: 8 mg/dL (ref 6–24)
Bilirubin Total: 0.6 mg/dL (ref 0.0–1.2)
CO2: 23 mmol/L (ref 20–29)
Calcium: 9.4 mg/dL (ref 8.7–10.2)
Chloride: 99 mmol/L (ref 96–106)
Creatinine, Ser: 0.77 mg/dL (ref 0.76–1.27)
Globulin, Total: 3.8 g/dL (ref 1.5–4.5)
Glucose: 143 mg/dL — ABNORMAL HIGH (ref 70–99)
Potassium: 3.9 mmol/L (ref 3.5–5.2)
Sodium: 136 mmol/L (ref 134–144)
Total Protein: 8.1 g/dL (ref 6.0–8.5)
eGFR: 108 mL/min/{1.73_m2} (ref >59)

## 2021-12-29 LAB — HEMOGLOBIN A1C
Est. average glucose Bld gHb Est-mCnc: 235 mg/dL
Hgb A1c MFr Bld: 9.8 % — ABNORMAL HIGH (ref 4.8–5.6)

## 2021-12-29 MED ORDER — METFORMIN HCL 500 MG PO TABS: 500.0000 mg | ORAL_TABLET | Freq: Two times a day (BID) | ORAL | 1 refills | Status: DC

## 2021-12-29 NOTE — Progress Notes (Signed)
Endo ref placed as well as sent in met. To [facility] [address]

## 2021-12-31 ENCOUNTER — Telehealth: Payer: Self-pay

## 2021-12-31 NOTE — Telephone Encounter (Signed)
Pt came in asking where he was suppose to go this Monday. I called and got his 2 appts. And wrote them down for him. The first was GI Tawni Pummel Monday Sept 11 @ 1:30 [facility] [place]. AND then [facility] Endo November 1st @ 9:30 @ [address]. [place]. Phone is [phone]

## 2022-01-03 ENCOUNTER — Other Ambulatory Visit: Payer: Self-pay | Admitting: Gastroenterology

## 2022-01-03 DIAGNOSIS — R748 Abnormal levels of other serum enzymes: Secondary | ICD-10-CM

## 2022-01-03 DIAGNOSIS — K760 Fatty (change of) liver, not elsewhere classified: Secondary | ICD-10-CM

## 2022-01-12 ENCOUNTER — Ambulatory Visit
Admission: RE | Admit: 2022-01-12 | Discharge: 2022-01-12 | Disposition: A | Discharge status: Home / Self Care | Payer: 59 | Source: Ambulatory Visit | Attending: Gastroenterology | Admitting: Gastroenterology

## 2022-01-12 DIAGNOSIS — K760 Fatty (change of) liver, not elsewhere classified: Secondary | ICD-10-CM

## 2022-01-12 DIAGNOSIS — R748 Abnormal levels of other serum enzymes: Secondary | ICD-10-CM

## 2022-01-17 ENCOUNTER — Ambulatory Visit
Admission: RE | Admit: 2022-01-17 | Discharge: 2022-01-17 | Disposition: A | Discharge status: Home / Self Care | Payer: 59 | Source: Ambulatory Visit | Attending: Gastroenterology | Admitting: Gastroenterology

## 2022-01-17 DIAGNOSIS — K760 Fatty (change of) liver, not elsewhere classified: Secondary | ICD-10-CM | POA: Diagnosis present

## 2022-01-17 DIAGNOSIS — R748 Abnormal levels of other serum enzymes: Secondary | ICD-10-CM | POA: Insufficient documentation

## 2022-02-10 ENCOUNTER — Ambulatory Visit: Payer: 59 | Admitting: Family Medicine

## 2022-02-10 VITALS — BP 120/76 | HR 60 | Ht 70.0 in | Wt 306.0 lb

## 2022-02-10 DIAGNOSIS — E119 Type 2 diabetes mellitus without complications: Secondary | ICD-10-CM | POA: Diagnosis not present

## 2022-02-10 MED ORDER — METFORMIN HCL 500 MG PO TABS: 500.0000 mg | ORAL_TABLET | Freq: Two times a day (BID) | ORAL | 1 refills | Status: AC

## 2022-02-10 NOTE — Progress Notes (Signed)
Date:  02/10/2022   Name:  Joshua Shaw   DOB:  1970/10/06   MRN:  [id number]   Chief Complaint: Diabetes (Follow up diabetes- new onset)  Diabetes    Lab Results  Component Value Date   NA 136 12/28/2021   K 3.9 12/28/2021   CO2 23 12/28/2021   GLUCOSE 143 (H) 12/28/2021   BUN 8 12/28/2021   CREATININE 0.77 12/28/2021   CALCIUM 9.4 12/28/2021   EGFR 108 12/28/2021   GFRNONAA 107 08/12/2019   Lab Results  Component Value Date   CHOL 240 (H) 12/28/2021   HDL 25 (L) 12/28/2021   LDLCALC 88 12/28/2021   TRIG 766 (HH) 12/28/2021   CHOLHDL 6.1 (H) 08/12/2019   No results found for: "TSH" Lab Results  Component Value Date   HGBA1C 9.8 (H) 12/28/2021   No results found for: "WBC", "HGB", "HCT", "MCV", "PLT" Lab Results  Component Value Date   ALT 56 (H) 12/28/2021   AST 47 (H) 12/28/2021   ALKPHOS 145 (H) 12/28/2021   BILITOT 0.6 12/28/2021   No results found for: "25OHVITD2", "25OHVITD3", "VD25OH"   Review of Systems  Patient Active Problem List   Diagnosis Date Noted   Hepatic cirrhosis ([place]) 08/12/2019    No Known Allergies  Past Surgical History:  Procedure Laterality Date   NO PAST SURGERIES      Social History   Tobacco Use   Smoking status: Former    Years: 8.00    Types: Cigarettes    Quit date: 1998    Years since quitting: 25.8   Smokeless tobacco: Never  Vaping Use   Vaping Use: Never used  Substance Use Topics   Alcohol use: Not Currently   Drug use: Not Currently     Medication list has been reviewed and updated.  Current Meds  Medication Sig   aspirin EC 81 MG tablet Take 81 mg by mouth daily.   ezetimibe (ZETIA) 10 MG tablet Take 1 tablet (10 mg total) by mouth daily.   lisinopril-hydrochlorothiazide (ZESTORETIC) 20-25 MG tablet Take 1 tablet by mouth daily.   metFORMIN (GLUCOPHAGE) 500 MG tablet Take 1 tablet (500 mg total) by mouth 2 (two) times daily with a meal.   niacin 500 MG tablet Take 500 mg by mouth at  bedtime. otc   Omega-3 Fatty Acids (FISH OIL) 1000 MG CPDR Take 1,200 mg by mouth in the morning and at bedtime.        02/10/2022    2:38 PM 12/28/2021    2:47 PM 05/31/2021    1:24 PM 11/04/2020   10:15 AM  GAD 7 : Generalized Anxiety Score  Nervous, Anxious, on Edge 0 0 0 0  Control/stop worrying 0 0 0 0  Worry too much - different things 0 0 0 0  Trouble relaxing 0 0 0 0  Restless 0 0 0 0  Easily annoyed or irritable 0 0 0 0  Afraid - awful might happen 0 0 0 0  Total GAD 7 Score 0 0 0 0  Anxiety Difficulty Not difficult at all Not difficult at all Not difficult at all        02/10/2022    2:38 PM 12/28/2021    2:47 PM 05/31/2021    1:23 PM  Depression screen PHQ 2/9  Decreased Interest 0 0 0  Down, Depressed, Hopeless 0 0 0  PHQ - 2 Score 0 0 0  Altered sleeping 0 0 0  Tired, decreased  energy 0 0 0  Change in appetite 0 0 0  Feeling bad or failure about yourself  0 0 0  Trouble concentrating 0 0 0  Moving slowly or fidgety/restless 0 0 0  Suicidal thoughts 0 0 0  PHQ-9 Score 0 0 0  Difficult doing work/chores Not difficult at all Not difficult at all Not difficult at all    BP Readings from Last 3 Encounters:  02/10/22 120/76  12/28/21 138/80  05/31/21 122/82    Physical Exam Vitals and nursing note reviewed.  HENT:     Head: Normocephalic.     Right Ear: Tympanic membrane and external ear normal.     Left Ear: Tympanic membrane and external ear normal.     Nose: Nose normal. No congestion or rhinorrhea.  Eyes:     General: No scleral icterus.       Right eye: No discharge.        Left eye: No discharge.     Conjunctiva/sclera: Conjunctivae normal.     Pupils: Pupils are equal, round, and reactive to light.  Neck:     Thyroid: No thyromegaly.     Vascular: No JVD.     Trachea: No tracheal deviation.  Cardiovascular:     Rate and Rhythm: Normal rate and regular rhythm.     Heart sounds: Normal heart sounds. No murmur heard.    No friction rub. No gallop.   Pulmonary:     Effort: No respiratory distress.     Breath sounds: Normal breath sounds. No wheezing, rhonchi or rales.  Abdominal:     General: Bowel sounds are normal.     Palpations: Abdomen is soft. There is no mass.     Tenderness: There is no abdominal tenderness. There is no guarding or rebound.  Musculoskeletal:        General: No tenderness. Normal range of motion.     Cervical back: Normal range of motion and neck supple.  Lymphadenopathy:     Cervical: No cervical adenopathy.  Skin:    General: Skin is warm.     Findings: No rash.  Neurological:     Mental Status: He is alert and oriented to person, place, and time.     Cranial Nerves: No cranial nerve deficit.     Deep Tendon Reflexes: Reflexes are normal and symmetric.     Wt Readings from Last 3 Encounters:  02/10/22 (!) 306 lb (138.8 kg)  12/28/21 (!) 324 lb (147 kg)  05/31/21 (!) 326 lb (147.9 kg)    BP 120/76   Pulse 60   Ht 5' 10"  (1.778 m)   Wt (!) 306 lb (138.8 kg)   SpO2 97%   BMI 43.91 kg/m   Assessment and Plan: 1. New onset type 2 diabetes mellitus ([place]) New onset.  Persistent.  Questionable control and to look at A1c.  Patient has had 3 weeks of excellent readings per history of the 88-1 10 range on blood sugars.  We will recheck triglycerides as well as A1c because patient has upcoming DOT physical and needs to have some indication of control.  I told him that he is only had 3 weeks of good control on the medication and that it would be better to have another 3 to 4 weeks before checking but he would like to have it at this time to have some evidence of guidelines.    Otilio Miu, MD

## 2022-02-11 LAB — MICROALBUMIN / CREATININE URINE RATIO
Creatinine, Urine: 147.2 mg/dL
Microalb/Creat Ratio: 60 mg/g creat — ABNORMAL HIGH (ref 0–29)
Microalbumin, Urine: 87.6 ug/mL

## 2022-02-11 LAB — LIPID PANEL WITH LDL/HDL RATIO
Cholesterol, Total: 171 mg/dL (ref 100–199)
HDL: 30 mg/dL — ABNORMAL LOW (ref >39)
LDL Chol Calc (NIH): 99 mg/dL (ref 0–99)
LDL/HDL Ratio: 3.3 ratio (ref 0.0–3.6)
Triglycerides: 242 mg/dL — ABNORMAL HIGH (ref 0–149)
VLDL Cholesterol Cal: 42 mg/dL — ABNORMAL HIGH (ref 5–40)

## 2022-02-11 LAB — HEMOGLOBIN A1C
Est. average glucose Bld gHb Est-mCnc: 151 mg/dL
Hgb A1c MFr Bld: 6.9 % — ABNORMAL HIGH (ref 4.8–5.6)

## 2022-02-22 DIAGNOSIS — E1165 Type 2 diabetes mellitus with hyperglycemia: Secondary | ICD-10-CM | POA: Insufficient documentation

## 2022-02-22 DIAGNOSIS — E1129 Type 2 diabetes mellitus with other diabetic kidney complication: Secondary | ICD-10-CM | POA: Insufficient documentation

## 2022-02-23 DIAGNOSIS — E785 Hyperlipidemia, unspecified: Secondary | ICD-10-CM | POA: Insufficient documentation

## 2022-06-28 ENCOUNTER — Ambulatory Visit: Payer: 59 | Admitting: Family Medicine

## 2022-06-28 ENCOUNTER — Encounter: Payer: Self-pay | Admitting: Family Medicine

## 2022-06-28 VITALS — BP 126/78 | HR 73 | Ht 70.0 in | Wt 270.0 lb

## 2022-06-28 DIAGNOSIS — E782 Mixed hyperlipidemia: Secondary | ICD-10-CM

## 2022-06-28 DIAGNOSIS — D696 Thrombocytopenia, unspecified: Secondary | ICD-10-CM

## 2022-06-28 DIAGNOSIS — I10 Essential (primary) hypertension: Secondary | ICD-10-CM | POA: Diagnosis not present

## 2022-06-28 DIAGNOSIS — K760 Fatty (change of) liver, not elsewhere classified: Secondary | ICD-10-CM

## 2022-06-28 MED ORDER — EZETIMIBE 10 MG PO TABS: 10.0000 mg | ORAL_TABLET | Freq: Every day | ORAL | 1 refills | Status: DC

## 2022-06-28 MED ORDER — LISINOPRIL-HYDROCHLOROTHIAZIDE 20-25 MG PO TABS: 1.0000 | ORAL_TABLET | Freq: Every day | ORAL | 1 refills | Status: DC

## 2022-06-28 NOTE — Progress Notes (Signed)
Date:  06/28/2022   Name:  Joshua Shaw   DOB:  [birth year]   MRN:  [id number]   Chief Complaint: Hyperlipidemia, Hypertension, and Colon Cancer Screening  Hyperlipidemia This is a chronic problem. The current episode started more than 1 year ago. The problem is controlled. Recent lipid tests were reviewed and are normal. He has no history of chronic renal disease. There are no known factors aggravating his hyperlipidemia. Pertinent negatives include no chest pain, focal sensory loss, focal weakness, leg pain, myalgias or shortness of breath. Current antihyperlipidemic treatment includes ezetimibe. The current treatment provides moderate improvement of lipids. There are no compliance problems.  Risk factors for coronary artery disease include dyslipidemia.  Hypertension This is a chronic problem. The current episode started more than 1 year ago. The problem has been gradually improving since onset. The problem is controlled. Pertinent negatives include no blurred vision, chest pain, headaches, neck pain, palpitations or shortness of breath. There are no associated agents to hypertension. Risk factors for coronary artery disease include dyslipidemia. Past treatments include ACE inhibitors and diuretics. The current treatment provides moderate improvement. There are no compliance problems.  There is no history of CAD/MI or CVA. There is no history of chronic renal disease.    Lab Results  Component Value Date   NA 136 12/28/2021   K 3.9 12/28/2021   CO2 23 12/28/2021   GLUCOSE 143 (H) 12/28/2021   BUN 8 12/28/2021   CREATININE 0.77 12/28/2021   CALCIUM 9.4 12/28/2021   EGFR 108 12/28/2021   GFRNONAA 107 08/12/2019   Lab Results  Component Value Date   CHOL 171 02/10/2022   HDL 30 (L) 02/10/2022   LDLCALC 99 02/10/2022   TRIG 242 (H) 02/10/2022   CHOLHDL 6.1 (H) 08/12/2019   No results found for: "TSH" Lab Results  Component Value Date   HGBA1C 6.9 (H) 02/10/2022   No  results found for: "WBC", "HGB", "HCT", "MCV", "PLT" Lab Results  Component Value Date   ALT 56 (H) 12/28/2021   AST 47 (H) 12/28/2021   ALKPHOS 145 (H) 12/28/2021   BILITOT 0.6 12/28/2021   No results found for: "25OHVITD2", "25OHVITD3", "VD25OH"   Review of Systems  Constitutional:  Negative for chills and fever.  HENT:  Negative for drooling, ear discharge, ear pain and sore throat.   Eyes:  Negative for blurred vision.  Respiratory:  Negative for cough, shortness of breath and wheezing.   Cardiovascular:  Negative for chest pain, palpitations and leg swelling.  Gastrointestinal:  Negative for abdominal pain, blood in stool, constipation, diarrhea and nausea.  Endocrine: Negative for polydipsia.  Genitourinary:  Negative for dysuria, frequency, hematuria and urgency.  Musculoskeletal:  Negative for back pain, myalgias and neck pain.  Skin:  Negative for rash.  Allergic/Immunologic: Negative for environmental allergies.  Neurological:  Negative for dizziness, focal weakness and headaches.  Hematological:  Does not bruise/bleed easily.  Psychiatric/Behavioral:  Negative for suicidal ideas. The patient is not nervous/anxious.     Patient Active Problem List   Diagnosis Date Noted   Hepatic cirrhosis ([place]) 08/12/2019    No Known Allergies  Past Surgical History:  Procedure Laterality Date   NO PAST SURGERIES      Social History   Tobacco Use   Smoking status: Former    Years: 8.00    Types: Cigarettes    Quit date: 1998    Years since quitting: 26.1   Smokeless tobacco: Never  Vaping Use  Vaping Use: Never used  Substance Use Topics   Alcohol use: Not Currently   Drug use: Not Currently     Medication list has been reviewed and updated.  Current Meds  Medication Sig   aspirin EC 81 MG tablet Take 81 mg by mouth daily.   ezetimibe (ZETIA) 10 MG tablet Take 1 tablet (10 mg total) by mouth daily.   lisinopril-hydrochlorothiazide (ZESTORETIC) 20-25 MG tablet  Take 1 tablet by mouth daily.   metFORMIN (GLUCOPHAGE) 500 MG tablet Take 1 tablet (500 mg total) by mouth 2 (two) times daily with a meal. (Patient taking differently: Take 1,000 mg by mouth 2 (two) times daily with a meal. pou)   MOUNJARO 5 MG/0.5ML Pen Inject 5 mg into the skin once a week. endo   niacin 500 MG tablet Take 500 mg by mouth at bedtime. otc   Omega-3 Fatty Acids (FISH OIL) 1000 MG CPDR Take 1,200 mg by mouth in the morning and at bedtime.        06/28/2022    1:48 PM 02/10/2022    2:38 PM 12/28/2021    2:47 PM 05/31/2021    1:24 PM  GAD 7 : Generalized Anxiety Score  Nervous, Anxious, on Edge 0 0 0 0  Control/stop worrying 0 0 0 0  Worry too much - different things 0 0 0 0  Trouble relaxing 0 0 0 0  Restless 0 0 0 0  Easily annoyed or irritable 0 0 0 0  Afraid - awful might happen 0 0 0 0  Total GAD 7 Score 0 0 0 0  Anxiety Difficulty Not difficult at all Not difficult at all Not difficult at all Not difficult at all       06/28/2022    1:48 PM 02/10/2022    2:38 PM 12/28/2021    2:47 PM  Depression screen PHQ 2/9  Decreased Interest 0 0 0  Down, Depressed, Hopeless 0 0 0  PHQ - 2 Score 0 0 0  Altered sleeping 0 0 0  Tired, decreased energy 0 0 0  Change in appetite 0 0 0  Feeling bad or failure about yourself  0 0 0  Trouble concentrating 0 0 0  Moving slowly or fidgety/restless 0 0 0  Suicidal thoughts 0 0 0  PHQ-9 Score 0 0 0  Difficult doing work/chores Not difficult at all Not difficult at all Not difficult at all    BP Readings from Last 3 Encounters:  06/28/22 126/78  02/10/22 120/76  12/28/21 138/80    Physical Exam Vitals and nursing note reviewed.  HENT:     Head: Normocephalic.     Right Ear: Tympanic membrane, ear canal and external ear normal.     Left Ear: Tympanic membrane, ear canal and external ear normal.     Nose: Nose normal.     Mouth/Throat:     Mouth: Mucous membranes are moist.  Eyes:     General: No scleral icterus.        Right eye: No discharge.        Left eye: No discharge.     Conjunctiva/sclera: Conjunctivae normal.     Pupils: Pupils are equal, round, and reactive to light.  Neck:     Thyroid: No thyromegaly.     Vascular: No carotid bruit or JVD.     Trachea: No tracheal deviation.  Cardiovascular:     Rate and Rhythm: Normal rate and regular rhythm.     Heart  sounds: Normal heart sounds. No murmur heard.    No friction rub. No gallop.  Pulmonary:     Effort: No respiratory distress.     Breath sounds: Normal breath sounds. No wheezing, rhonchi or rales.  Abdominal:     General: Bowel sounds are normal.     Palpations: Abdomen is soft. There is no mass.     Tenderness: There is no abdominal tenderness. There is no guarding or rebound.  Musculoskeletal:        General: No tenderness. Normal range of motion.     Cervical back: Normal range of motion and neck supple.  Lymphadenopathy:     Cervical: No cervical adenopathy.  Skin:    General: Skin is warm.     Findings: No rash.  Neurological:     Mental Status: He is alert.     Cranial Nerves: No cranial nerve deficit.     Wt Readings from Last 3 Encounters:  06/28/22 270 lb (122.5 kg)  02/10/22 (!) 306 lb (138.8 kg)  12/28/21 (!) 324 lb (147 kg)    BP 126/78   Pulse 73   Ht '5\' 10"'$  (1.778 m)   Wt 270 lb (122.5 kg)   SpO2 97%   BMI 38.74 kg/m   Assessment and Plan: 1. Essential hypertension Chronic.  Controlled.  Stable.  Blood pressure today 126/78.  Continue lisinopril hydrochlorothiazide 20-25 mg once a day.  Will check CMP for electrolytes and GFR. - lisinopril-hydrochlorothiazide (ZESTORETIC) 20-25 MG tablet; Take 1 tablet by mouth daily.  Dispense: 90 tablet; Refill: 1 - Comprehensive Metabolic Panel (CMET)  2. Mixed hyperlipidemia Chronic.  Controlled.  Stable.  Continue Zetia 10 mg once a day.  Patient does not remember why we are not using a statin but we will recheck lipid to see what current L DL status is. -  ezetimibe (ZETIA) 10 MG tablet; Take 1 tablet (10 mg total) by mouth daily.  Dispense: 90 tablet; Refill: 1 - Lipid Panel With LDL/HDL Ratio  3. Thrombocytopenia ([facility]) Chronic.  Controlled.  Stable.  Patient's had low platelet counts in the past presumably from the liver disease.  Will check CBC for current status. - CBC with Differential/Platelet  4. Nonalcoholic fatty liver Chronic.  Controlled.  Stable.  Will be returning to gastroenterology we are sent placing referral at this time to reestablish and to follow current liver disease. - Ambulatory referral to Gastroenterology     Otilio Miu, MD

## 2022-06-29 LAB — COMPREHENSIVE METABOLIC PANEL
ALT: 33 IU/L (ref 0–44)
AST: 39 IU/L (ref 0–40)
Albumin/Globulin Ratio: 1.4 (ref 1.2–2.2)
Albumin: 4.4 g/dL (ref 3.8–4.9)
Alkaline Phosphatase: 85 IU/L (ref 44–121)
BUN/Creatinine Ratio: 18 (ref 9–20)
BUN: 13 mg/dL (ref 6–24)
Bilirubin Total: 0.7 mg/dL (ref 0.0–1.2)
CO2: 21 mmol/L (ref 20–29)
Calcium: 9.1 mg/dL (ref 8.7–10.2)
Chloride: 103 mmol/L (ref 96–106)
Creatinine, Ser: 0.74 mg/dL — ABNORMAL LOW (ref 0.76–1.27)
Globulin, Total: 3.2 g/dL (ref 1.5–4.5)
Glucose: 81 mg/dL (ref 70–99)
Potassium: 4 mmol/L (ref 3.5–5.2)
Sodium: 140 mmol/L (ref 134–144)
Total Protein: 7.6 g/dL (ref 6.0–8.5)
eGFR: 109 mL/min/{1.73_m2} (ref >59)

## 2022-06-29 LAB — CBC WITH DIFFERENTIAL/PLATELET
Basophils Absolute: 0 10*3/uL (ref 0.0–0.2)
Basos: 1 %
EOS (ABSOLUTE): 0.1 10*3/uL (ref 0.0–0.4)
Eos: 2 %
Hematocrit: 41.8 % (ref 37.5–51.0)
Hemoglobin: 14.3 g/dL (ref 13.0–17.7)
Immature Grans (Abs): 0 10*3/uL (ref 0.0–0.1)
Immature Granulocytes: 0 %
Lymphocytes Absolute: 1.3 10*3/uL (ref 0.7–3.1)
Lymphs: 31 %
MCH: 31 pg (ref 26.6–33.0)
MCHC: 34.2 g/dL (ref 31.5–35.7)
MCV: 91 fL (ref 79–97)
Monocytes Absolute: 0.4 10*3/uL (ref 0.1–0.9)
Monocytes: 9 %
Neutrophils Absolute: 2.4 10*3/uL (ref 1.4–7.0)
Neutrophils: 57 %
Platelets: 97 10*3/uL — CL (ref 150–450)
RBC: 4.62 x10E6/uL (ref 4.14–5.80)
RDW: 13.2 % (ref 11.6–15.4)
WBC: 4.1 10*3/uL (ref 3.4–10.8)

## 2022-06-29 LAB — LIPID PANEL WITH LDL/HDL RATIO
Cholesterol, Total: 136 mg/dL (ref 100–199)
HDL: 36 mg/dL — ABNORMAL LOW (ref >39)
LDL Chol Calc (NIH): 76 mg/dL (ref 0–99)
LDL/HDL Ratio: 2.1 ratio (ref 0.0–3.6)
Triglycerides: 138 mg/dL (ref 0–149)
VLDL Cholesterol Cal: 24 mg/dL (ref 5–40)

## 2022-07-06 ENCOUNTER — Other Ambulatory Visit: Payer: Self-pay | Admitting: Gastroenterology

## 2022-07-06 DIAGNOSIS — R791 Abnormal coagulation profile: Secondary | ICD-10-CM

## 2022-07-06 DIAGNOSIS — K760 Fatty (change of) liver, not elsewhere classified: Secondary | ICD-10-CM

## 2022-07-06 DIAGNOSIS — R748 Abnormal levels of other serum enzymes: Secondary | ICD-10-CM

## 2022-07-06 DIAGNOSIS — D696 Thrombocytopenia, unspecified: Secondary | ICD-10-CM

## 2022-07-11 ENCOUNTER — Telehealth: Payer: Self-pay | Admitting: Family Medicine

## 2022-07-11 NOTE — Telephone Encounter (Signed)
Copied from [facility] [id number]. Topic: General - Inquiry >> Jul 11, 2022  8:03 AM Ludger Nutting wrote: Patient called to get results from [place] done on 06/28/22. No notes on labs from provider. Please follow up with patient asap.

## 2022-08-22 ENCOUNTER — Ambulatory Visit
Admission: RE | Admit: 2022-08-22 | Discharge: 2022-08-22 | Disposition: A | Discharge status: Home / Self Care | Payer: 59 | Source: Ambulatory Visit | Attending: Gastroenterology | Admitting: Gastroenterology

## 2022-08-22 DIAGNOSIS — D696 Thrombocytopenia, unspecified: Secondary | ICD-10-CM

## 2022-08-22 DIAGNOSIS — R791 Abnormal coagulation profile: Secondary | ICD-10-CM | POA: Insufficient documentation

## 2022-08-22 DIAGNOSIS — R748 Abnormal levels of other serum enzymes: Secondary | ICD-10-CM | POA: Diagnosis present

## 2022-08-22 DIAGNOSIS — K760 Fatty (change of) liver, not elsewhere classified: Secondary | ICD-10-CM

## 2022-08-22 MED ORDER — IOHEXOL 300 MG/ML  SOLN
100.0000 mL | Freq: Once | INTRAMUSCULAR | Status: AC | PRN
  Administered 2022-08-22: 100 mL via INTRAVENOUS

## 2022-12-28 LAB — HEMOGLOBIN A1C: Hemoglobin A1C: 5.3

## 2022-12-28 LAB — BASIC METABOLIC PANEL
BUN: 17 (ref 4–21)
Chloride: 105 (ref 99–108)
Creatinine: 0.7 (ref 0.6–1.3)
Glucose: 77
Potassium: 4.1 meq/L (ref 3.5–5.1)
Sodium: 140 (ref 137–147)

## 2022-12-28 LAB — HM HEPATITIS C SCREENING LAB: HM Hepatitis Screen: NEGATIVE

## 2022-12-28 LAB — HEPATITIS B SURFACE ANTIGEN: Hepatitis B Surface Ag: NEGATIVE

## 2022-12-28 LAB — COMPREHENSIVE METABOLIC PANEL: Calcium: 10.1 (ref 8.7–10.7)

## 2023-01-09 ENCOUNTER — Ambulatory Visit: Payer: 59 | Admitting: Family Medicine

## 2023-01-09 ENCOUNTER — Encounter: Payer: Self-pay | Admitting: Family Medicine

## 2023-01-09 VITALS — BP 120/78 | HR 74 | Ht 70.0 in | Wt 279.0 lb

## 2023-01-09 DIAGNOSIS — I10 Essential (primary) hypertension: Secondary | ICD-10-CM | POA: Diagnosis not present

## 2023-01-09 DIAGNOSIS — Z7984 Long term (current) use of oral hypoglycemic drugs: Secondary | ICD-10-CM | POA: Diagnosis not present

## 2023-01-09 DIAGNOSIS — E782 Mixed hyperlipidemia: Secondary | ICD-10-CM | POA: Diagnosis not present

## 2023-01-09 DIAGNOSIS — E119 Type 2 diabetes mellitus without complications: Secondary | ICD-10-CM | POA: Diagnosis not present

## 2023-01-09 MED ORDER — EZETIMIBE 10 MG PO TABS
10.0000 mg | ORAL_TABLET | Freq: Every day | ORAL | 1 refills | Status: DC
Start: 2023-01-09 — End: 2023-01-09

## 2023-01-09 MED ORDER — LISINOPRIL-HYDROCHLOROTHIAZIDE 20-25 MG PO TABS
1.0000 | ORAL_TABLET | Freq: Every day | ORAL | 1 refills | Status: DC
Start: 2023-01-09 — End: 2023-09-15

## 2023-01-09 MED ORDER — EZETIMIBE 10 MG PO TABS
10.0000 mg | ORAL_TABLET | Freq: Every day | ORAL | 1 refills | Status: DC
Start: 2023-01-09 — End: 2023-09-15

## 2023-01-09 NOTE — Progress Notes (Signed)
Date:  01/09/2023   Name:  Joshua Shaw   DOB:  Oct 24, 1970   MRN:  [id number]   Chief Complaint: Hypertension, Diabetes (Check micro per endo), and Hyperlipidemia  Hypertension This is a chronic problem. The current episode started more than 1 year ago. The problem has been gradually improving since onset. The problem is controlled. Pertinent negatives include no blurred vision, chest pain, headaches, neck pain, orthopnea, palpitations, peripheral edema, PND or shortness of breath. There are no associated agents to hypertension. Risk factors for coronary artery disease include dyslipidemia. Past treatments include diuretics. The current treatment provides moderate improvement. There are no compliance problems.  There is no history of kidney disease, CAD/MI or CVA. There is no history of chronic renal disease, a hypertension causing med or renovascular disease.  Diabetes He presents for his follow-up diabetic visit. He has type 2 diabetes mellitus. Pertinent negatives for hypoglycemia include no dizziness or headaches. Pertinent negatives for diabetes include no blurred vision, no chest pain, no fatigue, no polydipsia and no polyuria. Pertinent negatives for diabetic complications include no CVA. Current diabetic treatment includes oral agent (monotherapy) (monjaro). He is following a generally healthy diet. Meal planning includes avoidance of concentrated sweets and carbohydrate counting. An ACE inhibitor/angiotensin II receptor blocker is being taken.  Hyperlipidemia This is a chronic problem. The current episode started more than 1 year ago. The problem is controlled. Recent lipid tests were reviewed and are normal. He has no history of chronic renal disease. There are no known factors aggravating his hyperlipidemia. Pertinent negatives include no chest pain, focal sensory loss, leg pain, myalgias or shortness of breath. Current antihyperlipidemic treatment includes ezetimibe. The current  treatment provides mild improvement of lipids. There are no compliance problems.  Risk factors for coronary artery disease include dyslipidemia.    Lab Results  Component Value Date   NA 140 12/28/2022   K 4.1 12/28/2022   CO2 21 06/28/2022   GLUCOSE 81 06/28/2022   BUN 17 12/28/2022   CREATININE 0.7 12/28/2022   CALCIUM 10.1 12/28/2022   EGFR 109 06/28/2022   GFRNONAA 107 08/12/2019   Lab Results  Component Value Date   CHOL 136 06/28/2022   HDL 36 (L) 06/28/2022   LDLCALC 76 06/28/2022   TRIG 138 06/28/2022   CHOLHDL 6.1 (H) 08/12/2019   No results found for: "TSH" Lab Results  Component Value Date   HGBA1C 5.3 12/28/2022   Lab Results  Component Value Date   WBC 4.1 06/28/2022   HGB 14.3 06/28/2022   HCT 41.8 06/28/2022   MCV 91 06/28/2022   PLT 97 (LL) 06/28/2022   Lab Results  Component Value Date   ALT 33 06/28/2022   AST 39 06/28/2022   ALKPHOS 85 06/28/2022   BILITOT 0.7 06/28/2022   No results found for: "25OHVITD2", "25OHVITD3", "VD25OH"   Review of Systems  Constitutional:  Negative for diaphoresis, fatigue, fever and unexpected weight change.  HENT:  Positive for trouble swallowing. Negative for ear pain and postnasal drip.   Eyes:  Negative for blurred vision and visual disturbance.  Respiratory:  Negative for shortness of breath and wheezing.   Cardiovascular:  Negative for chest pain, palpitations, orthopnea, leg swelling and PND.  Gastrointestinal:  Negative for abdominal pain.  Endocrine: Negative for polydipsia and polyuria.  Genitourinary:  Negative for difficulty urinating.  Musculoskeletal:  Negative for myalgias and neck pain.  Neurological:  Negative for dizziness and headaches.  Hematological:  Negative for adenopathy. Does not  bruise/bleed easily.    Patient Active Problem List   Diagnosis Date Noted   Hepatic cirrhosis ([facility]) 08/12/2019    No Known Allergies  Past Surgical History:  Procedure Laterality Date   NO PAST  SURGERIES      Social History   Tobacco Use   Smoking status: Former    Current packs/day: 0.00    Types: Cigarettes    Start date: 1    Quit date: 1998    Years since quitting: 26.7   Smokeless tobacco: Never  Vaping Use   Vaping status: Never Used  Substance Use Topics   Alcohol use: Not Currently   Drug use: Not Currently     Medication list has been reviewed and updated.  Current Meds  Medication Sig   aspirin EC 81 MG tablet Take 81 mg by mouth daily.   ezetimibe (ZETIA) 10 MG tablet Take 1 tablet (10 mg total) by mouth daily.   lisinopril-hydrochlorothiazide (ZESTORETIC) 20-25 MG tablet Take 1 tablet by mouth daily.   metFORMIN (GLUCOPHAGE) 500 MG tablet Take 1 tablet (500 mg total) by mouth 2 (two) times daily with a meal. (Patient taking differently: Take 1,000 mg by mouth 2 (two) times daily with a meal. pou)   MOUNJARO 5 MG/0.5ML Pen Inject 5 mg into the skin once a week. endo   niacin 500 MG tablet Take 500 mg by mouth at bedtime. otc   Omega-3 Fatty Acids (FISH OIL) 1000 MG CPDR Take 1,200 mg by mouth in the morning and at bedtime.        01/09/2023   10:08 AM 06/28/2022    1:48 PM 02/10/2022    2:38 PM 12/28/2021    2:47 PM  GAD 7 : Generalized Anxiety Score  Nervous, Anxious, on Edge 0 0 0 0  Control/stop worrying 0 0 0 0  Worry too much - different things 0 0 0 0  Trouble relaxing 0 0 0 0  Restless 0 0 0 0  Easily annoyed or irritable 0 0 0 0  Afraid - awful might happen 0 0 0 0  Total GAD 7 Score 0 0 0 0  Anxiety Difficulty Not difficult at all Not difficult at all Not difficult at all Not difficult at all       01/09/2023   10:08 AM 06/28/2022    1:48 PM 02/10/2022    2:38 PM  Depression screen PHQ 2/9  Decreased Interest 0 0 0  Down, Depressed, Hopeless 0 0 0  PHQ - 2 Score 0 0 0  Altered sleeping 0 0 0  Tired, decreased energy 0 0 0  Change in appetite 0 0 0  Feeling bad or failure about yourself  0 0 0  Trouble concentrating 0 0 0   Moving slowly or fidgety/restless 0 0 0  Suicidal thoughts 0 0 0  PHQ-9 Score 0 0 0  Difficult doing work/chores Not difficult at all Not difficult at all Not difficult at all    BP Readings from Last 3 Encounters:  01/09/23 120/78  06/28/22 126/78  02/10/22 120/76    Physical Exam Vitals and nursing note reviewed.  HENT:     Head: Normocephalic.     Right Ear: Tympanic membrane and external ear normal.     Left Ear: Tympanic membrane and external ear normal.     Nose: Nose normal. No congestion or rhinorrhea.     Mouth/Throat:     Mouth: Mucous membranes are moist.  Eyes:  General: No scleral icterus.       Right eye: No discharge.        Left eye: No discharge.     Conjunctiva/sclera: Conjunctivae normal.     Pupils: Pupils are equal, round, and reactive to light.  Neck:     Thyroid: No thyromegaly.     Vascular: No JVD.     Trachea: No tracheal deviation.  Cardiovascular:     Rate and Rhythm: Normal rate and regular rhythm.     Heart sounds: Normal heart sounds. No murmur heard.    No friction rub. No gallop.  Pulmonary:     Effort: No respiratory distress.     Breath sounds: Normal breath sounds. No wheezing, rhonchi or rales.  Abdominal:     General: Bowel sounds are normal.     Palpations: Abdomen is soft. There is no mass.     Tenderness: There is no abdominal tenderness. There is no guarding or rebound.  Musculoskeletal:        General: No tenderness. Normal range of motion.     Cervical back: Normal range of motion and neck supple.  Lymphadenopathy:     Cervical: No cervical adenopathy.  Skin:    General: Skin is warm.     Findings: No rash.  Neurological:     Mental Status: He is alert.     Deep Tendon Reflexes:     Reflex Scores:      Patellar reflexes are 2+ on the right side and 2+ on the left side.    Wt Readings from Last 3 Encounters:  01/09/23 279 lb (126.6 kg)  06/28/22 270 lb (122.5 kg)  02/10/22 (!) 306 lb (138.8 kg)    BP  120/78   Pulse 74   Ht 5\' 10"  (1.778 m)   Wt 279 lb (126.6 kg)   SpO2 96%   BMI 40.03 kg/m   Assessment and Plan: 1. Essential hypertension Chronic.  Controlled.  Stable.  Blood pressure 120/78.  Asymptomatic.  Tolerating medication well.  Continue lisinopril hydrochlorothiazide 20-25 mg once a day.  Will check blood pressure in 6 months.  Review of previous labs with endocrinology are acceptable. - lisinopril-hydrochlorothiazide (ZESTORETIC) 20-25 MG tablet; Take 1 tablet by mouth daily.  Dispense: 90 tablet; Refill: 1  2. Mixed hyperlipidemia Chronic.  Controlled.  Stable.  Asymptomatic.  Tolerating Zetia well.  Continue Zetia 10 mg once a day.  Review of lipid panel per endocrinology is acceptable.  Will recheck in 6 months. - ezetimibe (ZETIA) 10 MG tablet; Take 1 tablet (10 mg total) by mouth daily.  Dispense: 90 tablet; Refill: 1  3. New onset type 2 diabetes mellitus ([facility]) New onset.  Tolerating medications well.  Currently on Mounjaro and metformin.  Patient was not unable to produce urine for microalbuminuria and we we will obtain this sent and refer results to endocrinology. - Microalbumin / creatinine urine ratio     Elizabeth Sauer, MD

## 2023-01-10 LAB — MICROALBUMIN / CREATININE URINE RATIO
Creatinine, Urine: 35.7 mg/dL
Microalb/Creat Ratio: 44 mg/g{creat} — ABNORMAL HIGH (ref 0–29)
Microalbumin, Urine: 15.7 ug/mL

## 2023-04-03 ENCOUNTER — Encounter: Payer: Self-pay | Admitting: *Deleted

## 2023-04-10 ENCOUNTER — Encounter
Admission: RE | Disposition: A | Discharge status: Home / Self Care | Payer: Self-pay | Source: Home / Self Care | Attending: Gastroenterology

## 2023-04-10 ENCOUNTER — Ambulatory Visit: Payer: 59 | Admitting: Registered Nurse

## 2023-04-10 ENCOUNTER — Ambulatory Visit
Admission: RE | Admit: 2023-04-10 | Discharge: 2023-04-10 | Disposition: A | Discharge status: Home / Self Care | Payer: 59 | Attending: Gastroenterology | Admitting: Gastroenterology

## 2023-04-10 ENCOUNTER — Other Ambulatory Visit: Payer: Self-pay

## 2023-04-10 ENCOUNTER — Encounter: Payer: Self-pay | Admitting: *Deleted

## 2023-04-10 DIAGNOSIS — Z7984 Long term (current) use of oral hypoglycemic drugs: Secondary | ICD-10-CM | POA: Insufficient documentation

## 2023-04-10 DIAGNOSIS — K635 Polyp of colon: Secondary | ICD-10-CM | POA: Diagnosis not present

## 2023-04-10 DIAGNOSIS — Z7985 Long-term (current) use of injectable non-insulin antidiabetic drugs: Secondary | ICD-10-CM | POA: Insufficient documentation

## 2023-04-10 DIAGNOSIS — I10 Essential (primary) hypertension: Secondary | ICD-10-CM | POA: Diagnosis not present

## 2023-04-10 DIAGNOSIS — Z800 Family history of malignant neoplasm of digestive organs: Secondary | ICD-10-CM | POA: Diagnosis not present

## 2023-04-10 DIAGNOSIS — Z6841 Body Mass Index (BMI) 40.0 and over, adult: Secondary | ICD-10-CM | POA: Insufficient documentation

## 2023-04-10 DIAGNOSIS — Z1211 Encounter for screening for malignant neoplasm of colon: Secondary | ICD-10-CM | POA: Diagnosis present

## 2023-04-10 DIAGNOSIS — R933 Abnormal findings on diagnostic imaging of other parts of digestive tract: Secondary | ICD-10-CM | POA: Insufficient documentation

## 2023-04-10 DIAGNOSIS — K641 Second degree hemorrhoids: Secondary | ICD-10-CM | POA: Diagnosis not present

## 2023-04-10 DIAGNOSIS — I8500 Esophageal varices without bleeding: Secondary | ICD-10-CM | POA: Insufficient documentation

## 2023-04-10 HISTORY — PX: ESOPHAGOGASTRODUODENOSCOPY (EGD) WITH PROPOFOL: SHX5813

## 2023-04-10 HISTORY — PX: COLONOSCOPY WITH PROPOFOL: SHX5780

## 2023-04-10 HISTORY — PX: POLYPECTOMY: SHX5525

## 2023-04-10 SURGERY — COLONOSCOPY WITH PROPOFOL
Anesthesia: General

## 2023-04-10 MED ORDER — PROPOFOL 10 MG/ML IV BOLUS
INTRAVENOUS | Status: AC
  Filled 2023-04-10: qty 20

## 2023-04-10 MED ORDER — LIDOCAINE HCL (PF) 2 % IJ SOLN
INTRAMUSCULAR | Status: AC
Start: 2023-04-10 — End: ?
  Filled 2023-04-10: qty 5

## 2023-04-10 MED ORDER — DEXMEDETOMIDINE HCL IN NACL 80 MCG/20ML IV SOLN
INTRAVENOUS | Status: DC | PRN
  Administered 2023-04-10: 8 ug via INTRAVENOUS

## 2023-04-10 MED ORDER — LIDOCAINE HCL (CARDIAC) PF 100 MG/5ML IV SOSY
PREFILLED_SYRINGE | INTRAVENOUS | Status: DC | PRN
  Administered 2023-04-10: 100 mg via INTRAVENOUS

## 2023-04-10 MED ORDER — PROPOFOL 10 MG/ML IV BOLUS
INTRAVENOUS | Status: DC | PRN
  Administered 2023-04-10: 50 mg via INTRAVENOUS
  Administered 2023-04-10: 150 mg via INTRAVENOUS
  Administered 2023-04-10: 50 mg via INTRAVENOUS
  Administered 2023-04-10: 100 ug/kg/min via INTRAVENOUS

## 2023-04-10 MED ORDER — SODIUM CHLORIDE 0.9 % IV SOLN: INTRAVENOUS | Status: DC

## 2023-04-10 NOTE — Anesthesia Preprocedure Evaluation (Signed)
Anesthesia Evaluation  Patient identified by MRN, date of birth, ID band Patient awake    Reviewed: Allergy & Precautions, NPO status , Patient's Chart, lab work & pertinent test results  History of Anesthesia Complications Negative for: history of anesthetic complications  Airway Mallampati: II  TM Distance: >3 FB Neck ROM: Full    Dental no notable dental hx. (+) Teeth Intact   Pulmonary neg pulmonary ROS, neg sleep apnea, neg COPD, Patient abstained from smoking.Not current smoker, former smoker   Pulmonary exam normal breath sounds clear to auscultation       Cardiovascular Exercise Tolerance: Good METShypertension, Pt. on medications (-) CAD and (-) Past MI (-) dysrhythmias  Rhythm:Regular Rate:Normal - Systolic murmurs    Neuro/Psych negative neurological ROS  negative psych ROS   GI/Hepatic ,neg GERD  ,,(+)     (-) substance abuse    Endo/Other  diabetes  Class 3 obesityHas not taken GLP1 agonist in over a month  Renal/GU negative Renal ROS     Musculoskeletal   Abdominal  (+) + obese  Peds  Hematology   Anesthesia Other Findings Past Medical History: No date: Hyperlipidemia No date: Hypertension  Reproductive/Obstetrics                             Anesthesia Physical Anesthesia Plan  ASA: 3  Anesthesia Plan: General   Post-op Pain Management: Minimal or no pain anticipated   Induction: Intravenous  PONV Risk Score and Plan: 2 and Propofol infusion, TIVA and Ondansetron  Airway Management Planned: Nasal Cannula  Additional Equipment: None  Intra-op Plan:   Post-operative Plan:   Informed Consent: I have reviewed the patients History and Physical, chart, labs and discussed the procedure including the risks, benefits and alternatives for the proposed anesthesia with the patient or authorized representative who has indicated his/her understanding and acceptance.      Dental advisory given  Plan Discussed with: CRNA and Surgeon  Anesthesia Plan Comments: (Discussed risks of anesthesia with patient, including possibility of difficulty with spontaneous ventilation under anesthesia necessitating airway intervention, PONV, and rare risks such as cardiac or respiratory or neurological events, and allergic reactions. Discussed the role of CRNA in patient's perioperative care. Patient understands.)       Anesthesia Quick Evaluation

## 2023-04-10 NOTE — Op Note (Signed)
[facility] Patient Name: Joshua Shaw Procedure Date: 04/10/2023 11:56 AM MRN: [id number] Account #: [id number] Date of Birth: Aug 27, 1970 Admit Type: Outpatient Age: 52 Room: [facility] ENDO ROOM 3 Gender: Male Note Status: Finalized Instrument Name: Prentice Docker [id number] Procedure:             Colonoscopy Indications:           Screening for colorectal malignant neoplasm Providers:             Eather Colas MD, MD Referring MD:          Duanne Limerick, MD (Referring MD) Medicines:             Monitored Anesthesia Care Complications:         No immediate complications. Estimated blood loss:                         Minimal. Procedure:             Pre-Anesthesia Assessment:                        - Prior to the procedure, a History and Physical was                         performed, and patient medications and allergies were                         reviewed. The patient is competent. The risks and                         benefits of the procedure and the sedation options and                         risks were discussed with the patient. All questions                         were answered and informed consent was obtained.                         Patient identification and proposed procedure were                         verified by the physician, the nurse, the                         anesthesiologist, the anesthetist and the technician                         in the endoscopy suite. Mental Status Examination:                         alert and oriented. Airway Examination: normal                         oropharyngeal airway and neck mobility. Respiratory                         Examination: clear to auscultation. CV Examination:  normal. Prophylactic Antibiotics: The patient does not                         require prophylactic antibiotics. Prior                         Anticoagulants: The patient has taken no anticoagulant                          or antiplatelet agents. ASA Grade Assessment: III - A                         patient with severe systemic disease. After reviewing                         the risks and benefits, the patient was deemed in                         satisfactory condition to undergo the procedure. The                         anesthesia plan was to use monitored anesthesia care                         (MAC). Immediately prior to administration of                         medications, the patient was re-assessed for adequacy                         to receive sedatives. The heart rate, respiratory                         rate, oxygen saturations, blood pressure, adequacy of                         pulmonary ventilation, and response to care were                         monitored throughout the procedure. The physical                         status of the patient was re-assessed after the                         procedure.                        After obtaining informed consent, the colonoscope was                         passed under direct vision. Throughout the procedure,                         the patient's blood pressure, pulse, and oxygen                         saturations were monitored continuously. The  Colonoscope was introduced through the anus and                         advanced to the the terminal ileum. The colonoscopy                         was performed without difficulty. The patient                         tolerated the procedure well. The quality of the bowel                         preparation was good. The terminal ileum, ileocecal                         valve, appendiceal orifice, and rectum were                         photographed. Findings:      The perianal and digital rectal examinations were normal.      The terminal ileum appeared normal.      A 2 mm polyp was found in the distal transverse colon. The polyp was       sessile. The polyp  was removed with a cold snare. Resection and       retrieval were complete. Estimated blood loss was minimal.      Internal hemorrhoids were found during retroflexion. The hemorrhoids       were Grade II (internal hemorrhoids that prolapse but reduce       spontaneously).      The exam was otherwise without abnormality on direct and retroflexion       views. Impression:            - The examined portion of the ileum was normal.                        - One 2 mm polyp in the distal transverse colon,                         removed with a cold snare. Resected and retrieved.                        - Internal hemorrhoids.                        - The examination was otherwise normal on direct and                         retroflexion views. Recommendation:        - Discharge patient to home.                        - Resume previous diet.                        - Continue present medications.                        - Await pathology results.                        -  Repeat colonoscopy in 7-10 years for surveillance.                        - Return to referring physician as previously                         scheduled. Procedure Code(s):     --- Professional ---                        [phone], Colonoscopy, flexible; with removal of                         tumor(s), polyp(s), or other lesion(s) by snare                         technique Diagnosis Code(s):     --- Professional ---                        Z12.11, Encounter for screening for malignant neoplasm                         of colon                        K64.1, Second degree hemorrhoids                        D12.3, Benign neoplasm of transverse colon (hepatic                         flexure or splenic flexure) CPT copyright 2022 American Medical Association. All rights reserved. The codes documented in this report are preliminary and upon coder review may  be revised to meet current compliance requirements. Eather Colas MD,  MD 04/10/2023 12:20:36 PM Number of Addenda: 0 Note Initiated On: 04/10/2023 11:56 AM Scope Withdrawal Time: 0 hours 9 minutes 3 seconds  Total Procedure Duration: 0 hours 14 minutes 25 seconds  Estimated Blood Loss:  Estimated blood loss was minimal.      [facility]

## 2023-04-10 NOTE — Transfer of Care (Signed)
Immediate Anesthesia Transfer of Care Note  Patient: Joshua Shaw  Procedure(s) Performed: COLONOSCOPY WITH PROPOFOL ESOPHAGOGASTRODUODENOSCOPY (EGD) WITH PROPOFOL POLYPECTOMY  Patient Location: Endoscopy Unit  Anesthesia Type:General  Level of Consciousness: drowsy and patient cooperative  Airway & Oxygen Therapy: Patient Spontanous Breathing  Post-op Assessment: Report given to RN and Patient moving all extremities  Post vital signs: Reviewed and stable  Last Vitals:  Vitals Value Taken Time  BP 135/79 04/10/23 1219  Temp    Pulse 69 04/10/23 1219  Resp 17 04/10/23 1219  SpO2 97 % 04/10/23 1219  Vitals shown include unfiled device data.  Last Pain:  Vitals:   04/10/23 1120  TempSrc: Temporal  PainSc: 0-No pain         Complications: No notable events documented.

## 2023-04-10 NOTE — Anesthesia Procedure Notes (Signed)
Procedure Name: MAC Date/Time: 04/10/2023 11:46 AM  Performed by: Lily Lovings, CRNAPre-anesthesia Checklist: Patient identified, Emergency Drugs available, Suction available and Patient being monitored Patient Re-evaluated:Patient Re-evaluated prior to induction Oxygen Delivery Method: Simple face mask Preoxygenation: Pre-oxygenation with 100% oxygen Induction Type: IV induction Comments: POM

## 2023-04-10 NOTE — Anesthesia Postprocedure Evaluation (Signed)
Anesthesia Post Note  Patient: Joshua Shaw  Procedure(s) Performed: COLONOSCOPY WITH PROPOFOL ESOPHAGOGASTRODUODENOSCOPY (EGD) WITH PROPOFOL POLYPECTOMY  Patient location during evaluation: Endoscopy Anesthesia Type: General Level of consciousness: awake and alert Pain management: pain level controlled Vital Signs Assessment: post-procedure vital signs reviewed and stable Respiratory status: spontaneous breathing, nonlabored ventilation, respiratory function stable and patient connected to nasal cannula oxygen Cardiovascular status: blood pressure returned to baseline and stable Postop Assessment: no apparent nausea or vomiting Anesthetic complications: no   No notable events documented.   Last Vitals:  Vitals:   04/10/23 1229 04/10/23 1239  BP: 107/67 114/63  Pulse: 63 63  Resp: 17 (!) 21  Temp:    SpO2: 96% 96%    Last Pain:  Vitals:   04/10/23 1239  TempSrc:   PainSc: 0-No pain                 Corinda Gubler

## 2023-04-10 NOTE — Op Note (Signed)
[facility] Patient Name: Joshua Shaw Procedure Date: 04/10/2023 12:16 PM MRN: [id number] Account #: [id number] Date of Birth: Sep 01, 1970 Admit Type: Outpatient Age: 52 Room: [facility] ENDO ROOM 3 Gender: Male Note Status: Finalized Instrument Name: Upper Endoscope [id number] Procedure:             Upper GI endoscopy Indications:           Abnormal CT of the GI tract Providers:             Eather Colas MD, MD Referring MD:          Duanne Limerick, MD (Referring MD) Medicines:             Monitored Anesthesia Care Complications:         No immediate complications. Procedure:             Pre-Anesthesia Assessment:                        - Prior to the procedure, a History and Physical was                         performed, and patient medications and allergies were                         reviewed. The patient is competent. The risks and                         benefits of the procedure and the sedation options and                         risks were discussed with the patient. All questions                         were answered and informed consent was obtained.                         Patient identification and proposed procedure were                         verified by the physician, the nurse, the                         anesthesiologist, the anesthetist and the technician                         in the endoscopy suite. Mental Status Examination:                         alert and oriented. Airway Examination: normal                         oropharyngeal airway and neck mobility. Respiratory                         Examination: clear to auscultation. CV Examination:                         normal. Prophylactic Antibiotics: The patient does not  require prophylactic antibiotics. Prior                         Anticoagulants: The patient has taken no anticoagulant                         or antiplatelet agents. ASA Grade  Assessment: III - A                         patient with severe systemic disease. After reviewing                         the risks and benefits, the patient was deemed in                         satisfactory condition to undergo the procedure. The                         anesthesia plan was to use monitored anesthesia care                         (MAC). Immediately prior to administration of                         medications, the patient was re-assessed for adequacy                         to receive sedatives. The heart rate, respiratory                         rate, oxygen saturations, blood pressure, adequacy of                         pulmonary ventilation, and response to care were                         monitored throughout the procedure. The physical                         status of the patient was re-assessed after the                         procedure.                        After obtaining informed consent, the endoscope was                         passed under direct vision. Throughout the procedure,                         the patient's blood pressure, pulse, and oxygen                         saturations were monitored continuously. The Endoscope                         was introduced through the mouth, and advanced to the  second part of duodenum. The upper GI endoscopy was                         accomplished without difficulty. The patient tolerated                         the procedure well. Findings:      Grade II varices were found in the middle third of the esophagus and in       the lower third of the esophagus. They were medium in size.      The entire examined stomach was normal.      The examined duodenum was normal. Impression:            - Grade II esophageal varices.                        - Normal stomach.                        - Normal examined duodenum.                        - No specimens collected. Recommendation:        -  Discharge patient to home.                        - Resume previous diet.                        - Continue present medications.                        - Give a beta blocker with dosage titrated by the                         heart rate.                        - Return to referring physician as previously                         scheduled. Procedure Code(s):     --- Professional ---                        [phone], Esophagogastroduodenoscopy, flexible,                         transoral; diagnostic, including collection of                         specimen(s) by brushing or washing, when performed                         (separate procedure) Diagnosis Code(s):     --- Professional ---                        I85.00, Esophageal varices without bleeding                        R93.3, Abnormal findings on diagnostic imaging of  other parts of digestive tract CPT copyright 2022 American Medical Association. All rights reserved. The codes documented in this report are preliminary and upon coder review may  be revised to meet current compliance requirements. Eather Colas MD, MD 04/10/2023 12:55:08 PM Number of Addenda: 0 Note Initiated On: 04/10/2023 12:16 PM Estimated Blood Loss:  Estimated blood loss: none.      [facility]

## 2023-04-10 NOTE — H&P (Signed)
Outpatient short stay form Pre-procedure 04/10/2023  Regis Bill, MD  Primary Physician: Duanne Limerick, MD  Reason for visit:  Abnormal CT of abdomen/Colon cancer screening  History of present illness:    52 y/o gentleman with history of hypertension, morbid obesity, and HLD here for EGD/Colonoscopy for CT scan that suggested cirrhosis and colon cancer screening. He has never had these procedures before. Platelets are in the 90's and INR 1.3. No blood thinners. No abdominal surgeries. Mother had stomach cancer in her 33's.    Current Facility-Administered Medications:    0.9 %  sodium chloride infusion, , Intravenous, Continuous, Latresha Yahr, Rossie Muskrat, MD, Last Rate: 20 mL/hr at 04/10/23 1119, New Bag at 04/10/23 1119  Medications Prior to Admission  Medication Sig Dispense Refill Last Dose/Taking   aspirin EC 81 MG tablet Take 81 mg by mouth daily.   04/09/2023   ezetimibe (ZETIA) 10 MG tablet Take 1 tablet (10 mg total) by mouth daily. 90 tablet 1 04/10/2023 at  6:00 AM   lisinopril-hydrochlorothiazide (ZESTORETIC) 20-25 MG tablet Take 1 tablet by mouth daily. 90 tablet 1 04/10/2023 at  6:00 AM   niacin 500 MG tablet Take 500 mg by mouth at bedtime. otc   04/09/2023   Omega-3 Fatty Acids (FISH OIL) 1000 MG CPDR Take 1,200 mg by mouth in the morning and at bedtime.    04/09/2023   metFORMIN (GLUCOPHAGE) 500 MG tablet Take 1 tablet (500 mg total) by mouth 2 (two) times daily with a meal. (Patient taking differently: Take 1,000 mg by mouth 2 (two) times daily with a meal. pou) 60 tablet 1    MOUNJARO 5 MG/0.5ML Pen Inject 5 mg into the skin once a week. endo        No Known Allergies   Past Medical History:  Diagnosis Date   Hyperlipidemia    Hypertension     Review of systems:  Otherwise negative.    Physical Exam  Gen: Alert, oriented. Appears stated age.  HEENT: PERRLA. Lungs: No respiratory distress CV: RRR Abd: soft, benign, no masses Ext: No  edema    Planned procedures: Proceed with EGD/colonoscopy. The patient understands the nature of the planned procedure, indications, risks, alternatives and potential complications including but not limited to bleeding, infection, perforation, damage to internal organs and possible oversedation/side effects from anesthesia. The patient agrees and gives consent to proceed.  Please refer to procedure notes for findings, recommendations and patient disposition/instructions.     Regis Bill, MD [facility]

## 2023-04-10 NOTE — Interval H&P Note (Signed)
History and Physical Interval Note:  04/10/2023 11:45 AM  Joshua Shaw  has presented today for surgery, with the diagnosis of Abnormal CT of the Abdomen Colon cancer screening.  The various methods of treatment have been discussed with the patient and family. After consideration of risks, benefits and other options for treatment, the patient has consented to  Procedure(s): COLONOSCOPY WITH PROPOFOL (N/A) ESOPHAGOGASTRODUODENOSCOPY (EGD) WITH PROPOFOL (N/A) as a surgical intervention.  The patient's history has been reviewed, patient examined, no change in status, stable for surgery.  I have reviewed the patient's chart and labs.  Questions were answered to the patient's satisfaction.     Regis Bill  Ok to proceed with EGD/Colonoscopy

## 2023-04-11 ENCOUNTER — Encounter: Payer: Self-pay | Admitting: Gastroenterology

## 2023-04-12 LAB — SURGICAL PATHOLOGY

## 2023-07-10 ENCOUNTER — Ambulatory Visit: Payer: Self-pay | Admitting: Family Medicine

## 2023-09-04 ENCOUNTER — Ambulatory Visit: Payer: Self-pay | Admitting: Family Medicine

## 2023-09-15 ENCOUNTER — Encounter: Payer: Self-pay | Admitting: Family Medicine

## 2023-09-15 ENCOUNTER — Ambulatory Visit: Payer: Self-pay | Admitting: Family Medicine

## 2023-09-15 VITALS — BP 126/81 | HR 71 | Resp 16 | Ht 70.0 in | Wt 299.0 lb

## 2023-09-15 DIAGNOSIS — E782 Mixed hyperlipidemia: Secondary | ICD-10-CM

## 2023-09-15 DIAGNOSIS — R7303 Prediabetes: Secondary | ICD-10-CM | POA: Diagnosis not present

## 2023-09-15 DIAGNOSIS — I10 Essential (primary) hypertension: Secondary | ICD-10-CM

## 2023-09-15 DIAGNOSIS — K7460 Unspecified cirrhosis of liver: Secondary | ICD-10-CM

## 2023-09-15 MED ORDER — EZETIMIBE 10 MG PO TABS: 10.0000 mg | ORAL_TABLET | Freq: Every day | ORAL | 1 refills | Status: DC

## 2023-09-15 MED ORDER — LISINOPRIL-HYDROCHLOROTHIAZIDE 20-25 MG PO TABS
1.0000 | ORAL_TABLET | Freq: Every day | ORAL | 1 refills | Status: DC
Start: 2023-09-15 — End: 2024-03-14

## 2023-09-15 NOTE — Progress Notes (Signed)
 Date:  09/15/2023   Name:  Joshua Shaw   DOB:  1971/04/08   MRN:  [id number]   Chief Complaint: Hypertension and Hyperlipidemia  Hypertension This is a chronic problem. The current episode started more than 1 year ago. The problem has been gradually improving since onset. The problem is controlled. Pertinent negatives include no blurred vision, chest pain, headaches, orthopnea, palpitations, PND or shortness of breath. Risk factors for coronary artery disease include diabetes mellitus and dyslipidemia. The current treatment provides moderate improvement. There is no history of CAD/MI. There is no history of chronic renal disease, a hypertension causing med or renovascular disease.  Hyperlipidemia This is a chronic problem. The current episode started more than 1 year ago. The problem is controlled. Recent lipid tests were reviewed and are normal. Exacerbating diseases include diabetes. He has no history of chronic renal disease, hypothyroidism, liver disease, obesity or nephrotic syndrome. Pertinent negatives include no chest pain, focal sensory loss, focal weakness, leg pain, myalgias or shortness of breath. Current antihyperlipidemic treatment includes ezetimibe . The current treatment provides moderate improvement of lipids. There are no compliance problems.  Risk factors for coronary artery disease include dyslipidemia, hypertension, diabetes mellitus, male sex and obesity.    Lab Results  Component Value Date   NA 140 12/28/2022   K 4.1 12/28/2022   CO2 21 06/28/2022   GLUCOSE 81 06/28/2022   BUN 17 12/28/2022   CREATININE 0.7 12/28/2022   CALCIUM 10.1 12/28/2022   EGFR 109 06/28/2022   GFRNONAA 107 08/12/2019   Lab Results  Component Value Date   CHOL 136 06/28/2022   HDL 36 (L) 06/28/2022   LDLCALC 76 06/28/2022   TRIG 138 06/28/2022   CHOLHDL 6.1 (H) 08/12/2019   No results found for: "TSH" Lab Results  Component Value Date   HGBA1C 5.3 12/28/2022   Lab  Results  Component Value Date   WBC 4.1 06/28/2022   HGB 14.3 06/28/2022   HCT 41.8 06/28/2022   MCV 91 06/28/2022   PLT 97 (LL) 06/28/2022   Lab Results  Component Value Date   ALT 33 06/28/2022   AST 39 06/28/2022   ALKPHOS 85 06/28/2022   BILITOT 0.7 06/28/2022   No results found for: "25OHVITD2", "25OHVITD3", "VD25OH"   Review of Systems  Eyes:  Negative for blurred vision and visual disturbance.  Respiratory:  Negative for choking, chest tightness, shortness of breath and wheezing.   Cardiovascular:  Negative for chest pain, palpitations, orthopnea and PND.  Gastrointestinal:  Negative for abdominal pain and blood in stool.  Musculoskeletal:  Negative for myalgias.  Neurological:  Negative for focal weakness and headaches.    Patient Active Problem List   Diagnosis Date Noted   Dyslipidemia, goal LDL below 100 02/23/2022   Microalbuminuria due to type 2 diabetes mellitus (HCC) 02/22/2022   Type 2 diabetes mellitus with hyperglycemia, without long-term current use of insulin (HCC) 02/22/2022   Hepatic cirrhosis (HCC) 08/12/2019   Hepatic cirrhosis (HCC) 08/12/2019    No Known Allergies  Past Surgical History:  Procedure Laterality Date   COLONOSCOPY WITH PROPOFOL  N/A 04/10/2023   Procedure: COLONOSCOPY WITH PROPOFOL ;  Surgeon: Shane Darling, MD;  Location: [facility] ENDOSCOPY;  Service: Endoscopy;  Laterality: N/A;   ESOPHAGOGASTRODUODENOSCOPY (EGD) WITH PROPOFOL  N/A 04/10/2023   Procedure: ESOPHAGOGASTRODUODENOSCOPY (EGD) WITH PROPOFOL ;  Surgeon: Shane Darling, MD;  Location: [facility] ENDOSCOPY;  Service: Endoscopy;  Laterality: N/A;   NO PAST SURGERIES     POLYPECTOMY  04/10/2023  Procedure: POLYPECTOMY;  Surgeon: Shane Darling, MD;  Location: [facility] ENDOSCOPY;  Service: Endoscopy;;    Social History   Tobacco Use   Smoking status: Former    Current packs/day: 0.00    Types: Cigarettes    Start date: 16    Quit date: 1998    Years since  quitting: 27.4   Smokeless tobacco: Never  Vaping Use   Vaping status: Never Used  Substance Use Topics   Alcohol use: Not Currently   Drug use: Not Currently     Medication list has been reviewed and updated.  Current Meds  Medication Sig   aspirin EC 81 MG tablet Take 81 mg by mouth daily.   ezetimibe  (ZETIA ) 10 MG tablet Take 1 tablet (10 mg total) by mouth daily.   lisinopril -hydrochlorothiazide  (ZESTORETIC ) 20-25 MG tablet Take 1 tablet by mouth daily.   niacin 500 MG tablet Take 500 mg by mouth at bedtime. otc   Omega-3 Fatty Acids (FISH OIL) 1000 MG CPDR Take 1,200 mg by mouth in the morning and at bedtime.        09/15/2023   11:11 AM 01/09/2023   10:08 AM 06/28/2022    1:48 PM 02/10/2022    2:38 PM  GAD 7 : Generalized Anxiety Score  Nervous, Anxious, on Edge 0 0 0 0  Control/stop worrying 0 0 0 0  Worry too much - different things 0 0 0 0  Trouble relaxing 0 0 0 0  Restless 0 0 0 0  Easily annoyed or irritable 0 0 0 0  Afraid - awful might happen 0 0 0 0  Total GAD 7 Score 0 0 0 0  Anxiety Difficulty Not difficult at all Not difficult at all Not difficult at all Not difficult at all       09/15/2023   11:11 AM 01/09/2023   10:08 AM 06/28/2022    1:48 PM  Depression screen PHQ 2/9  Decreased Interest 0 0 0  Down, Depressed, Hopeless 0 0 0  PHQ - 2 Score 0 0 0  Altered sleeping 0 0 0  Tired, decreased energy 0 0 0  Change in appetite 0 0 0  Feeling bad or failure about yourself  0 0 0  Trouble concentrating 0 0 0  Moving slowly or fidgety/restless 0 0 0  Suicidal thoughts 0 0 0  PHQ-9 Score 0 0 0  Difficult doing work/chores Not difficult at all Not difficult at all Not difficult at all    BP Readings from Last 3 Encounters:  09/15/23 126/81  04/10/23 114/63  01/09/23 120/78    Physical Exam Vitals reviewed.  Constitutional:      Appearance: He is well-developed.  HENT:     Head: Normocephalic and atraumatic.     Right Ear: Tympanic membrane, ear  canal and external ear normal.     Left Ear: Tympanic membrane, ear canal and external ear normal.     Nose: Nose normal.     Mouth/Throat:     Dentition: Normal dentition.  Eyes:     General: Lids are normal. No scleral icterus.    Conjunctiva/sclera: Conjunctivae normal.     Pupils: Pupils are equal, round, and reactive to light.  Neck:     Thyroid: No thyromegaly.     Vascular: No carotid bruit, hepatojugular reflux or JVD.     Trachea: No tracheal deviation.  Cardiovascular:     Rate and Rhythm: Normal rate and regular rhythm.  Heart sounds: Normal heart sounds.  Pulmonary:     Effort: Pulmonary effort is normal.     Breath sounds: Normal breath sounds.  Abdominal:     General: Bowel sounds are normal.     Palpations: Abdomen is soft. There is no hepatomegaly, splenomegaly or mass.     Tenderness: There is no abdominal tenderness.     Hernia: There is no hernia in the left inguinal area.  Musculoskeletal:        General: Normal range of motion.     Cervical back: Normal range of motion and neck supple.  Lymphadenopathy:     Cervical: No cervical adenopathy.  Skin:    General: Skin is warm and dry.     Findings: No rash.  Neurological:     Mental Status: He is alert and oriented to person, place, and time.     Sensory: No sensory deficit.     Deep Tendon Reflexes: Reflexes are normal and symmetric.  Psychiatric:        Mood and Affect: Mood is not anxious or depressed.     Wt Readings from Last 3 Encounters:  09/15/23 299 lb (135.6 kg)  04/10/23 282 lb (127.9 kg)  01/09/23 279 lb (126.6 kg)    BP 126/81   Pulse 71   Resp 16   Ht 5\' 10"  (1.778 m)   Wt 299 lb (135.6 kg)   SpO2 97%   BMI 42.90 kg/m   Assessment and Plan:  1. Essential hypertension (Primary) Chronic.  Controlled.  Stable.  Asymptomatic.  Blood pressure today is 126/81.  Tolerating medications well.  So we will continue lisinopril  hydrochlorothiazide  20-25 mg once a day.  Review of previous  renal function is acceptable and will recheck patient in 6 months. - lisinopril -hydrochlorothiazide  (ZESTORETIC ) 20-25 MG tablet; Take 1 tablet by mouth daily.  Dispense: 90 tablet; Refill: 1  2. Mixed hyperlipidemia Chronic.  Controlled.  Stable.  Patient states he is unable to take a statin and we will continue Zetia  10 mg once a day.  Will check lipid panel in 6 months. - ezetimibe  (ZETIA ) 10 MG tablet; Take 1 tablet (10 mg total) by mouth daily.  Dispense: 90 tablet; Refill: 1  3. Prediabetes Chronic.  Controlled.  Asymptomatic.  This is been in control range and we will continue with dietary control with limiting concentrated sweets and limiting carbohydrate intake.  4. Hepatic cirrhosis, unspecified hepatic cirrhosis type, unspecified whether ascites present (HCC) Chronic.  Controlled.  Stable.  Patient with hepatic function to be checked and pending this we will be referring back to his gastroenterologist Dr. Jenkins [facility] the patient is to call when he gets home.  We will check hepatic function at this time to see if there is any transaminase activity and forward this information to the gastroenterologist. - Hepatic Function Panel (6)    Alayne Allis, MD

## 2023-09-16 LAB — HEPATIC FUNCTION PANEL (6)
ALT: 36 IU/L (ref 0–44)
AST: 36 IU/L (ref 0–40)
Albumin: 4.6 g/dL (ref 3.8–4.9)
Alkaline Phosphatase: 93 IU/L (ref 44–121)
Bilirubin Total: 0.9 mg/dL (ref 0.0–1.2)
Bilirubin, Direct: 0.27 mg/dL (ref 0.00–0.40)

## 2023-09-17 ENCOUNTER — Ambulatory Visit: Payer: Self-pay | Admitting: Family Medicine

## 2023-09-22 LAB — HEMOGLOBIN A1C: Hemoglobin A1C: 6

## 2024-02-01 ENCOUNTER — Ambulatory Visit: Admitting: Family Medicine

## 2024-02-01 ENCOUNTER — Other Ambulatory Visit (HOSPITAL_COMMUNITY): Payer: Self-pay

## 2024-02-01 ENCOUNTER — Telehealth: Payer: Self-pay

## 2024-02-01 ENCOUNTER — Encounter: Payer: Self-pay | Admitting: Family Medicine

## 2024-02-01 ENCOUNTER — Telehealth: Payer: Self-pay | Admitting: Pharmacy Technician

## 2024-02-01 VITALS — BP 124/82 | HR 62 | Ht 70.0 in | Wt 297.4 lb

## 2024-02-01 DIAGNOSIS — Z7985 Long-term (current) use of injectable non-insulin antidiabetic drugs: Secondary | ICD-10-CM

## 2024-02-01 DIAGNOSIS — E1165 Type 2 diabetes mellitus with hyperglycemia: Secondary | ICD-10-CM | POA: Diagnosis not present

## 2024-02-01 DIAGNOSIS — K7460 Unspecified cirrhosis of liver: Secondary | ICD-10-CM

## 2024-02-01 DIAGNOSIS — D696 Thrombocytopenia, unspecified: Secondary | ICD-10-CM | POA: Diagnosis not present

## 2024-02-01 DIAGNOSIS — E785 Hyperlipidemia, unspecified: Secondary | ICD-10-CM | POA: Diagnosis not present

## 2024-02-01 DIAGNOSIS — Z125 Encounter for screening for malignant neoplasm of prostate: Secondary | ICD-10-CM

## 2024-02-01 DIAGNOSIS — E66813 Obesity, class 3: Secondary | ICD-10-CM

## 2024-02-01 DIAGNOSIS — Z7984 Long term (current) use of oral hypoglycemic drugs: Secondary | ICD-10-CM

## 2024-02-01 DIAGNOSIS — Z6841 Body Mass Index (BMI) 40.0 and over, adult: Secondary | ICD-10-CM

## 2024-02-01 LAB — POCT GLYCOSYLATED HEMOGLOBIN (HGB A1C): Hemoglobin A1C: 5.8 % — AB (ref 4.0–5.6)

## 2024-02-01 MED ORDER — TIRZEPATIDE 2.5 MG/0.5ML ~~LOC~~ SOAJ: 2.5000 mg | SUBCUTANEOUS | 0 refills | Status: DC

## 2024-02-01 NOTE — Telephone Encounter (Signed)
 Pharmacy Patient Advocate Encounter   Received notification from Pt Calls Messages that prior authorization for Mounjaro 2.5 mg/0.5ml pen is required/requested.   Insurance verification completed.   The patient is insured through [facility].   Per test claim: Refill too soon. PA is not needed at this time. Medication was filled 02/01/24. Next eligible fill date is 02/23/24.

## 2024-02-01 NOTE — Progress Notes (Signed)
 Established Patient Office Visit  Subjective   Patient ID: Joshua Shaw, male    DOB: Sep 03, 1970  Age: 53 y.o. MRN: [id number]  Chief Complaint  Patient presents with   Hypertension     Assessment & Plan:   Problem List Items Addressed This Visit       Digestive   Hepatic cirrhosis (HCC)     Endocrine   Type 2 diabetes mellitus with hyperglycemia, without long-term current use of insulin (HCC) - Primary   Relevant Medications   tirzepatide (MOUNJARO) 2.5 MG/0.5ML Pen   Other Relevant Orders   POCT HgB A1C (Completed)   Comprehensive Metabolic Panel (CMET)   Microalbumin / creatinine urine ratio     Hematopoietic and Hemostatic   Thrombocytopenia   Relevant Orders   CBC with Differential     Other   Dyslipidemia, goal LDL below 100   Relevant Medications   carvedilol (COREG) 3.125 MG tablet   Other Relevant Orders   Lipid panel   Other Visit Diagnoses       Screening for prostate cancer       Relevant Orders   PSA     Class 3 severe obesity due to excess calories with serious comorbidity and body mass index (BMI) of 40.0 to 44.9 in adult [facility])       Relevant Medications   tirzepatide (MOUNJARO) 2.5 MG/0.5ML Pen   Other Relevant Orders   TSH Rfx on Abnormal to Free T4     Assessment and Plan Assessment & Plan Type 2 diabetes mellitus Diabetes well-controlled with A1c 5.8%. Interested in Ozempic for [employer] management. - Start Mounjaro at 2.5 mg dose and adjust as needed. - Monitor blood glucose levels regularly. - Educate on dietary modifications to maintain blood sugar levels. - Discuss potential side effects of Mounjaro, including nausea and heartburn.  Liver cirrhosis, etiology unspecified Liver cirrhosis with normal liver function tests. Low platelet count possibly related to cirrhosis. - Continue follow-up with gastroenterologist. - Discuss potential causes of cirrhosis with gastroenterologist.  Essential hypertension Hypertension  generally well-controlled. Aware of anxiety impact on readings. - Continue current management of hypertension. - Monitor blood pressure regularly.  Obesity Obesity with BMI 42. Interested in weight loss medications. Mounjaro preferred for weight loss. - Start Mounjaro for [employer]. - Encourage dietary modifications and regular exercise.  Hyperlipidemia Due for lipid panel. - Order lipid panel.  Thrombocytopenia, unspecified Thrombocytopenia possibly related to liver cirrhosis. Previous platelet counts around 100,000/L. - Order CBC to assess current platelet count.  Obstructive sleep apnea, on CPAP Obstructive sleep apnea managed with CPAP. - Continue CPAP therapy.  General Health Maintenance Up to date on colonoscopy. No recent eye exam. No flu shot received before. - Recommend eye exam to screen for diabetic retinopathy.  Follow-Up Plans include monitoring and adjusting medications, ensuring lab work is up to date. - Schedule follow-up appointment in one month to assess response to Mounjaro and review lab results. - Order urine test for protein. - Order prostate level check.    No follow-ups on file.   HPI Discussed the use of AI scribe software for clinical note transcription with the patient, who gave verbal consent to proceed.  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  He manages his diabetes with metformin  and dietary changes. His last A1c, checked four months ago, was 5.8. Blood sugar levels range from 90 to  97 when fasting and 117 after meals. He maintains his sugar levels by eating grilled chicken, salads, and avoiding bread and sugary drinks. He was previously on Mounjaro but stopped due to insurance issues when the dosage was increased.  He has a history of liver cirrhosis with regular monitoring through ultrasounds and CT scans. He feels well  without any pain or sickness and has never been hospitalized. He attributes his liver condition to poor dietary habits in the past. He exercises regularly by walking four miles a night while working. He denies alcohol use.  He has hypertension, generally under control, with occasional readings of 138/80 but often in the 120s. Anxiety can elevate his blood pressure.  He has a history of low platelet counts. He uses a CPAP machine for sleep apnea, diagnosed four years ago.  He underwent a colonoscopy in December of the previous year, which revealed one small polyp. He has not had an eye exam recently but reports no vision problems other than needing reading glasses.     Review of Systems  All other systems reviewed and are negative.     Objective:     BP 124/82   Pulse 62   Ht 5' 10 (1.778 m)   Wt 297 lb 6 oz (134.9 kg)   SpO2 95%   BMI 42.67 kg/m    Physical Exam Vitals and nursing note reviewed.  Constitutional:      Appearance: Normal appearance.  HENT:     Head: Normocephalic.     Right Ear: External ear normal.     Left Ear: External ear normal.  Eyes:     Conjunctiva/sclera: Conjunctivae normal.  Cardiovascular:     Rate and Rhythm: Normal rate.  Pulmonary:     Effort: Pulmonary effort is normal. No respiratory distress.  Abdominal:     Palpations: Abdomen is soft.  Musculoskeletal:        General: Normal range of motion.  Skin:    General: Skin is warm.  Neurological:     Mental Status: He is alert and oriented to person, place, and time.  Psychiatric:        Mood and Affect: Mood normal.      Results for orders placed or performed in visit on 02/01/24  Hemoglobin A1c  Result Value Ref Range   Hemoglobin A1C 6.0   POCT HgB A1C  Result Value Ref Range   Hemoglobin A1C 5.8 (A) 4.0 - 5.6 %      The 10-year ASCVD risk score (Arnett DK, et al., 2019) is: 9.1%      Ladoris MARLA Ny, MD

## 2024-02-02 LAB — CBC WITH DIFFERENTIAL/PLATELET
Basophils Absolute: 0 x10E3/uL (ref 0.0–0.2)
Basos: 1 %
EOS (ABSOLUTE): 0.3 x10E3/uL (ref 0.0–0.4)
Eos: 8 %
Hematocrit: 45.2 % (ref 37.5–51.0)
Hemoglobin: 15 g/dL (ref 13.0–17.7)
Immature Grans (Abs): 0 x10E3/uL (ref 0.0–0.1)
Immature Granulocytes: 0 %
Lymphocytes Absolute: 1.5 x10E3/uL (ref 0.7–3.1)
Lymphs: 34 %
MCH: 30.9 pg (ref 26.6–33.0)
MCHC: 33.2 g/dL (ref 31.5–35.7)
MCV: 93 fL (ref 79–97)
Monocytes Absolute: 0.4 x10E3/uL (ref 0.1–0.9)
Monocytes: 10 %
Neutrophils Absolute: 2.1 x10E3/uL (ref 1.4–7.0)
Neutrophils: 47 %
Platelets: 99 x10E3/uL — CL (ref 150–450)
RBC: 4.85 x10E6/uL (ref 4.14–5.80)
RDW: 13.5 % (ref 11.6–15.4)
WBC: 4.4 x10E3/uL (ref 3.4–10.8)

## 2024-02-02 LAB — COMPREHENSIVE METABOLIC PANEL WITH GFR
ALT: 42 IU/L (ref 0–44)
AST: 39 IU/L (ref 0–40)
Albumin: 4.7 g/dL (ref 3.8–4.9)
Alkaline Phosphatase: 84 IU/L (ref 47–123)
BUN/Creatinine Ratio: 16 (ref 9–20)
BUN: 13 mg/dL (ref 6–24)
Bilirubin Total: 0.8 mg/dL (ref 0.0–1.2)
CO2: 24 mmol/L (ref 20–29)
Calcium: 9.7 mg/dL (ref 8.7–10.2)
Chloride: 98 mmol/L (ref 96–106)
Creatinine, Ser: 0.81 mg/dL (ref 0.76–1.27)
Globulin, Total: 3.1 g/dL (ref 1.5–4.5)
Glucose: 89 mg/dL (ref 70–99)
Potassium: 4.1 mmol/L (ref 3.5–5.2)
Sodium: 137 mmol/L (ref 134–144)
Total Protein: 7.8 g/dL (ref 6.0–8.5)
eGFR: 105 mL/min/1.73 (ref >59)

## 2024-02-02 LAB — LIPID PANEL
Chol/HDL Ratio: 6.5 ratio — ABNORMAL HIGH (ref 0.0–5.0)
Cholesterol, Total: 157 mg/dL (ref 100–199)
HDL: 24 mg/dL — ABNORMAL LOW (ref >39)
LDL Chol Calc (NIH): 84 mg/dL (ref 0–99)
Triglycerides: 294 mg/dL — ABNORMAL HIGH (ref 0–149)
VLDL Cholesterol Cal: 49 mg/dL — ABNORMAL HIGH (ref 5–40)

## 2024-02-02 LAB — TSH RFX ON ABNORMAL TO FREE T4: TSH: 2.36 u[IU]/mL (ref 0.450–4.500)

## 2024-02-02 LAB — PSA: Prostate Specific Ag, Serum: 0.3 ng/mL (ref 0.0–4.0)

## 2024-02-06 NOTE — Telephone Encounter (Signed)
 SABRA

## 2024-02-26 NOTE — Addendum Note (Signed)
 Addended by: CAMACHO OCAMPO, Brandalyn Harting M on: 02/26/2024 03:17 PM   Modules accepted: Orders

## 2024-02-26 NOTE — Telephone Encounter (Unsigned)
 Copied from CRM #[id number]. Topic: Clinical - Medication Refill >> Feb 26, 2024  2:22 PM Harlene ORN wrote: Medication: tirzepatide (MOUNJARO) 2.5 MG/0.5ML Pen  Has the patient contacted their pharmacy? No (Agent: If no, request that the patient contact the pharmacy for the refill. If patient does not wish to contact the pharmacy document the reason why and proceed with request.) (Agent: If yes, when and what did the pharmacy advise?)  This is the patient's preferred pharmacy:  [facility] [address] [place] - [address] [place] [address] Phone: [phone] Fax: [phone]  Is this the correct pharmacy for this prescription? Yes If no, delete pharmacy and type the correct one.   Has the prescription been filled recently? Yes  Is the patient out of the medication? No  Has the patient been seen for an appointment in the last year OR does the patient have an upcoming appointment? Yes  Can we respond through MyChart? Yes  Agent: Please be advised that Rx refills may take up to 3 business days. We ask that you follow-up with your pharmacy.

## 2024-02-29 ENCOUNTER — Other Ambulatory Visit: Payer: Self-pay | Admitting: Family Medicine

## 2024-02-29 DIAGNOSIS — E1165 Type 2 diabetes mellitus with hyperglycemia: Secondary | ICD-10-CM

## 2024-02-29 MED ORDER — TIRZEPATIDE 5 MG/0.5ML ~~LOC~~ SOAJ: 5.0000 mg | SUBCUTANEOUS | 0 refills | Status: AC

## 2024-02-29 NOTE — Telephone Encounter (Signed)
 New Rx with Increased strength sent

## 2024-02-29 NOTE — Telephone Encounter (Signed)
Please review rx refill request

## 2024-03-07 ENCOUNTER — Ambulatory Visit: Admitting: Family Medicine

## 2024-03-11 ENCOUNTER — Other Ambulatory Visit: Payer: Self-pay

## 2024-03-11 ENCOUNTER — Ambulatory Visit: Admitting: Family Medicine

## 2024-03-11 DIAGNOSIS — E782 Mixed hyperlipidemia: Secondary | ICD-10-CM

## 2024-03-11 MED ORDER — EZETIMIBE 10 MG PO TABS: 10.0000 mg | ORAL_TABLET | Freq: Every day | ORAL | 1 refills | Status: AC

## 2024-03-13 ENCOUNTER — Telehealth: Payer: Self-pay | Admitting: Family Medicine

## 2024-03-13 DIAGNOSIS — I10 Essential (primary) hypertension: Secondary | ICD-10-CM

## 2024-03-13 DIAGNOSIS — E782 Mixed hyperlipidemia: Secondary | ICD-10-CM

## 2024-03-13 NOTE — Telephone Encounter (Unsigned)
 Copied from CRM [id number]. Topic: Clinical - Medication Refill >> Mar 13, 2024  2:32 PM Thliyah D wrote: Medication: lisinopril -hydrochlorothiazide  (ZESTORETIC ) 20-25 MG tablet,  ezetimibe  (ZETIA ) 10 MG tablet   Has the patient contacted their pharmacy? Yes (Agent: If no, request that the patient contact the pharmacy for the refill. If patient does not wish to contact the pharmacy document the reason why and proceed with request.) (Agent: If yes, when and what did the pharmacy advise?)  This is the patient's preferred pharmacy:  [facility] [address], [place] - [address] [place] [address] Phone: [phone] Fax: [phone]  Is this the correct pharmacy for this prescription? Yes If no, delete pharmacy and type the correct one.   Has the prescription been filled recently? No  Is the patient out of the medication? No not yet  Has the patient been seen for an appointment in the last year OR does the patient have an upcoming appointment? Yes  Can we respond through MyChart? Yes  Agent: Please be advised that Rx refills may take up to 3 business days. We ask that you follow-up with your pharmacy.

## 2024-03-15 ENCOUNTER — Other Ambulatory Visit: Payer: Self-pay | Admitting: Gastroenterology

## 2024-03-15 DIAGNOSIS — K7460 Unspecified cirrhosis of liver: Secondary | ICD-10-CM

## 2024-03-15 MED ORDER — LISINOPRIL-HYDROCHLOROTHIAZIDE 20-25 MG PO TABS: 1.0000 | ORAL_TABLET | Freq: Every day | ORAL | 1 refills | Status: AC

## 2024-03-15 NOTE — Telephone Encounter (Signed)
 Refilled 03/11/24 #90 with 1 refill. Requested Prescriptions  Signed Prescriptions Disp Refills   lisinopril -hydrochlorothiazide  (ZESTORETIC ) 20-25 MG tablet 90 tablet 1    Sig: Take 1 tablet by mouth daily.     Cardiovascular:  ACEI + Diuretic Combos Passed - 03/15/2024  3:45 PM      Passed - Na in normal range and within 180 days    Sodium  Date Value Ref Range Status  02/01/2024 137 134 - 144 mmol/L Final         Passed - K in normal range and within 180 days    Potassium  Date Value Ref Range Status  02/01/2024 4.1 3.5 - 5.2 mmol/L Final         Passed - Cr in normal range and within 180 days    Creatinine, Ser  Date Value Ref Range Status  02/01/2024 0.81 0.76 - 1.27 mg/dL Final         Passed - eGFR is 30 or above and within 180 days    GFR calc Af Amer  Date Value Ref Range Status  08/12/2019 124 >59 mL/min/1.73 Final   GFR calc non Af Amer  Date Value Ref Range Status  08/12/2019 107 >59 mL/min/1.73 Final   eGFR  Date Value Ref Range Status  02/01/2024 105 >59 mL/min/1.73 Final         Passed - Patient is not pregnant      Passed - Last BP in normal range    BP Readings from Last 1 Encounters:  02/01/24 124/82         Passed - Valid encounter within last 6 months    Recent Outpatient Visits           1 month ago Type 2 diabetes mellitus with hyperglycemia, without long-term current use of insulin (HCC)   [place] [facility] Kotturi, Vinay K, MD   6 months ago Essential hypertension   [place] [facility], MD              Refused Prescriptions Disp Refills   ezetimibe  (ZETIA ) 10 MG tablet 90 tablet 1    Sig: Take 1 tablet (10 mg total) by mouth daily.     Cardiovascular:  Antilipid - Sterol Transport Inhibitors Failed - 03/15/2024  3:45 PM      Failed - Lipid Panel in normal range within the last 12 months    Cholesterol, Total  Date Value  Ref Range Status  02/01/2024 157 100 - 199 mg/dL Final   LDL Chol Calc (NIH)  Date Value Ref Range Status  02/01/2024 84 0 - 99 mg/dL Final   HDL  Date Value Ref Range Status  02/01/2024 24 (L) >39 mg/dL Final   Triglycerides  Date Value Ref Range Status  02/01/2024 294 (H) 0 - 149 mg/dL Final         Passed - AST in normal range and within 360 days    AST  Date Value Ref Range Status  02/01/2024 39 0 - 40 IU/L Final         Passed - ALT in normal range and within 360 days    ALT  Date Value Ref Range Status  02/01/2024 42 0 - 44 IU/L Final         Passed - Patient is not pregnant      Passed - Valid encounter within last 12 months  Recent Outpatient Visits           1 month ago Type 2 diabetes mellitus with hyperglycemia, without long-term current use of insulin (HCC)   [place] [facility] Kotturi, Vinay K, MD   6 months ago Essential hypertension   [facility] Joshua Cathryne JAYSON, MD

## 2024-03-15 NOTE — Telephone Encounter (Signed)
 Requested Prescriptions  Pending Prescriptions Disp Refills   ezetimibe  (ZETIA ) 10 MG tablet 90 tablet 1    Sig: Take 1 tablet (10 mg total) by mouth daily.     Cardiovascular:  Antilipid - Sterol Transport Inhibitors Failed - 03/15/2024  3:44 PM      Failed - Lipid Panel in normal range within the last 12 months    Cholesterol, Total  Date Value Ref Range Status  02/01/2024 157 100 - 199 mg/dL Final   LDL Chol Calc (NIH)  Date Value Ref Range Status  02/01/2024 84 0 - 99 mg/dL Final   HDL  Date Value Ref Range Status  02/01/2024 24 (L) >39 mg/dL Final   Triglycerides  Date Value Ref Range Status  02/01/2024 294 (H) 0 - 149 mg/dL Final         Passed - AST in normal range and within 360 days    AST  Date Value Ref Range Status  02/01/2024 39 0 - 40 IU/L Final         Passed - ALT in normal range and within 360 days    ALT  Date Value Ref Range Status  02/01/2024 42 0 - 44 IU/L Final         Passed - Patient is not pregnant      Passed - Valid encounter within last 12 months    Recent Outpatient Visits           1 month ago Type 2 diabetes mellitus with hyperglycemia, without long-term current use of insulin (HCC)   [place] [facility] at [facility] Kotturi, Vinay K, MD   6 months ago Essential hypertension   [place] [facility] Jones, Deanna C, MD               lisinopril -hydrochlorothiazide  (ZESTORETIC ) 20-25 MG tablet 90 tablet 1    Sig: Take 1 tablet by mouth daily.     Cardiovascular:  ACEI + Diuretic Combos Passed - 03/15/2024  3:44 PM      Passed - Na in normal range and within 180 days    Sodium  Date Value Ref Range Status  02/01/2024 137 134 - 144 mmol/L Final         Passed - K in normal range and within 180 days    Potassium  Date Value Ref Range Status  02/01/2024 4.1 3.5 - 5.2 mmol/L Final         Passed - Cr in normal range and within 180 days    Creatinine,  Ser  Date Value Ref Range Status  02/01/2024 0.81 0.76 - 1.27 mg/dL Final         Passed - eGFR is 30 or above and within 180 days    GFR calc Af Amer  Date Value Ref Range Status  08/12/2019 124 >59 mL/min/1.73 Final   GFR calc non Af Amer  Date Value Ref Range Status  08/12/2019 107 >59 mL/min/1.73 Final   eGFR  Date Value Ref Range Status  02/01/2024 105 >59 mL/min/1.73 Final         Passed - Patient is not pregnant      Passed - Last BP in normal range    BP Readings from Last 1 Encounters:  02/01/24 124/82         Passed - Valid encounter within last 6 months    Recent Outpatient Visits  1 month ago Type 2 diabetes mellitus with hyperglycemia, without long-term current use of insulin (HCC)   [place] [facility], Vinay K, MD   6 months ago Essential hypertension   [facility] Joshua Cathryne JAYSON, MD

## 2024-03-18 ENCOUNTER — Encounter: Payer: Self-pay | Admitting: Family Medicine

## 2024-03-18 ENCOUNTER — Ambulatory Visit: Admitting: Family Medicine

## 2024-03-18 VITALS — BP 118/76 | HR 63 | Ht 70.0 in | Wt 308.0 lb

## 2024-03-18 DIAGNOSIS — E782 Mixed hyperlipidemia: Secondary | ICD-10-CM

## 2024-03-18 DIAGNOSIS — G4733 Obstructive sleep apnea (adult) (pediatric): Secondary | ICD-10-CM

## 2024-03-18 DIAGNOSIS — E66813 Obesity, class 3: Secondary | ICD-10-CM

## 2024-03-18 DIAGNOSIS — Z7985 Long-term (current) use of injectable non-insulin antidiabetic drugs: Secondary | ICD-10-CM | POA: Diagnosis not present

## 2024-03-18 DIAGNOSIS — E1165 Type 2 diabetes mellitus with hyperglycemia: Secondary | ICD-10-CM | POA: Diagnosis not present

## 2024-03-18 DIAGNOSIS — Z6841 Body Mass Index (BMI) 40.0 and over, adult: Secondary | ICD-10-CM

## 2024-03-18 MED ORDER — TIRZEPATIDE 7.5 MG/0.5ML ~~LOC~~ SOAJ: 7.5000 mg | SUBCUTANEOUS | 0 refills | Status: DC

## 2024-03-18 NOTE — Progress Notes (Signed)
 Established Patient Office Visit  Patient ID: Joshua Shaw, male    DOB: December 03, 1970  Age: 53 y.o. MRN: [id number] PCP: Cree Napoli K, MD  Chief Complaint  Patient presents with   Diabetes    Subjective:     HPI  Discussed the use of AI scribe software for clinical note transcription with the patient, who gave verbal consent to proceed.  History of Present Illness Joshua Shaw is a 53 year old male with diabetes and hypertension who presents for medication management and weight loss consultation.  He is currently taking baby aspirin, Coreg, Zetia , lisinopril , hydrochlorothiazide  combination, metformin , omega-3, niacin, and Mounjaro . Mounjaro  costs him $25 per month, which he considers affordable compared to previous costs. He mentions a colleague on a higher dose of 7.5 mg and was unaware that the dose could exceed 5 mg.  He has gained 11 pounds, attributing it to poor eating habits, including consuming bread and potatoes, despite knowing that eating chicken and vegetables helps lower his triglycerides. He acknowledges not eating as well as he should and mentions working night shifts, which has impacted his eating habits. He does not consume sugary drinks, preferring unsweetened tea or water.  He has a history of sleep apnea and uses a CPAP machine, which prevents him from snoring. He recently received a new machine from his company.  He has had shingles in the past.  He plans to meet his sister and niece for the holidays but will have to work on Christmas night.  Wt Readings from Last 3 Encounters:  03/18/24 (!) 308 lb (139.7 kg)  02/01/24 297 lb 6 oz (134.9 kg)  09/15/23 299 lb (135.6 kg)       Review of Systems  All other systems reviewed and are negative.     Objective:     BP 118/76   Pulse 63   Ht 5' 10 (1.778 m)   Wt (!) 308 lb (139.7 kg)   SpO2 95%   BMI 44.19 kg/m     Physical Exam Vitals and nursing note reviewed.   Constitutional:      Appearance: Normal appearance.  HENT:     Head: Normocephalic.     Right Ear: External ear normal.     Left Ear: External ear normal.  Eyes:     Conjunctiva/sclera: Conjunctivae normal.  Cardiovascular:     Rate and Rhythm: Normal rate.  Pulmonary:     Effort: Pulmonary effort is normal. No respiratory distress.  Abdominal:     Palpations: Abdomen is soft.  Musculoskeletal:        General: Normal range of motion.  Skin:    General: Skin is warm.  Neurological:     Mental Status: He is alert and oriented to person, place, and time.  Psychiatric:        Mood and Affect: Mood normal.     Physical Exam VITALS: BP- 118/76   No results found for any visits on 03/18/24.     The 10-year ASCVD risk score (Arnett DK, et al., 2019) is: 14%    Assessment & Plan:   Problem List Items Addressed This Visit       Endocrine   Type 2 diabetes mellitus with hyperglycemia, without long-term current use of insulin (HCC) - Primary   Relevant Medications   tirzepatide  (MOUNJARO ) 7.5 MG/0.5ML Pen   Other Visit Diagnoses       Long-term current use of injectable noninsulin antidiabetic medication  Relevant Medications   tirzepatide  (MOUNJARO ) 7.5 MG/0.5ML Pen     Class 3 severe obesity due to excess calories with serious comorbidity and body mass index (BMI) of 40.0 to 44.9 in adult [facility])       Relevant Medications   tirzepatide  (MOUNJARO ) 7.5 MG/0.5ML Pen     Mixed hyperlipidemia         OSA on CPAP           Assessment and Plan Assessment & Plan Type 2 diabetes mellitus with hyperglycemia Managed with metformin  and Tirzepatide . No side effects from Mounjaro . Blood pressure controlled. - Increased Tirzepatide  to 5 mg for weight loss and glycemic control. - Continue metformin .  Obesity, class 3 Weight management prioritized. Mounjaro  dose increase supports weight loss. - Increased Tirzepatide  to 5 mg for weight loss. - Encouraged dietary  modifications: more vegetables, less bread and potatoes.  Mixed hyperlipidemia Triglycerides need improvement. Dietary habits suboptimal. - Encouraged dietary changes: more vegetables and chicken.  Hypertension Blood pressure controlled with current regimen. - Continue Carvedilol, lisinopril , and hydrochlorothiazide .  General Health Maintenance Discussed vaccinations: shingles, flu, pneumonia. Advised shingles vaccine despite past infection. Pneumonia vaccine recommended due to diabetes and age. - Consider shingles vaccine. - Consider pneumonia vaccine.    Return in about 9 weeks (around 05/20/2024).    Vinary K Kinsley Nicklaus, MD [facility] at [facility]

## 2024-04-24 ENCOUNTER — Other Ambulatory Visit: Payer: Self-pay | Admitting: Family Medicine

## 2024-04-24 DIAGNOSIS — E1165 Type 2 diabetes mellitus with hyperglycemia: Secondary | ICD-10-CM

## 2024-04-24 DIAGNOSIS — Z7985 Long-term (current) use of injectable non-insulin antidiabetic drugs: Secondary | ICD-10-CM

## 2024-04-24 NOTE — Telephone Encounter (Unsigned)
 Copied from CRM #[id number]. Topic: Clinical - Medication Refill >> Apr 24, 2024  1:06 PM Donna E wrote: Medication:  tirzepatide  (MOUNJARO ) 7.5 MG/0.5ML Pen Patient requesting 90 supply   Has the patient contacted their pharmacy? No No refills  This is the patient's preferred pharmacy:   [facility] [address], [place] - [address] [place] [address] Phone: [phone] Fax: [phone]  Is this the correct pharmacy for this prescription? Yes If no, delete pharmacy and type the correct one.   Has the prescription been filled recently? Yes  Is the patient out of the medication? Yes  Has the patient been seen for an appointment in the last year OR does the patient have an upcoming appointment? Yes  Can we respond through MyChart? Yes  Agent: Please be advised that Rx refills may take up to 3 business days. We ask that you follow-up with your pharmacy.

## 2024-04-26 ENCOUNTER — Other Ambulatory Visit: Payer: Self-pay

## 2024-04-26 DIAGNOSIS — Z7985 Long-term (current) use of injectable non-insulin antidiabetic drugs: Secondary | ICD-10-CM

## 2024-04-26 DIAGNOSIS — E1165 Type 2 diabetes mellitus with hyperglycemia: Secondary | ICD-10-CM

## 2024-04-26 MED ORDER — TIRZEPATIDE 7.5 MG/0.5ML ~~LOC~~ SOAJ: 7.5000 mg | SUBCUTANEOUS | 0 refills | Status: AC

## 2024-04-26 NOTE — Telephone Encounter (Signed)
 Requested medication (s) are due for refill today: yes  Requested medication (s) are on the active medication list: yes  Last refill:  03/18/24  Future visit scheduled: yes  Notes to clinic:  Medication not assigned to a protocol, review manually       Requested Prescriptions  Pending Prescriptions Disp Refills   tirzepatide  (MOUNJARO ) 7.5 MG/0.5ML Pen 2 mL 0    Sig: Inject 7.5 mg into the skin once a week.     Off-Protocol Failed - 04/26/2024  8:35 AM      Failed - Medication not assigned to a protocol, review manually.      Passed - Valid encounter within last 12 months    Recent Outpatient Visits           1 month ago Type 2 diabetes mellitus with hyperglycemia, without long-term current use of insulin (HCC)   [place] [facility], Vinay K, MD   2 months ago Type 2 diabetes mellitus with hyperglycemia, without long-term current use of insulin [facility])   [place] [facility] at [facility] Kotturi, Vinay K, MD   7 months ago Essential hypertension   [place] [facility] at [facility] Joshua Cathryne JAYSON, MD

## 2024-04-29 ENCOUNTER — Ambulatory Visit

## 2024-05-09 ENCOUNTER — Ambulatory Visit: Admitting: Family Medicine
